# Patient Record
Sex: Female | Born: 1965
Health system: Southern US, Community
[De-identification: ages and names within clinical notes are randomized; demographics above are authoritative.]

## PROBLEM LIST (undated history)

## (undated) DIAGNOSIS — G43909 Migraine, unspecified, not intractable, without status migrainosus: Secondary | ICD-10-CM

## (undated) DIAGNOSIS — J343 Hypertrophy of nasal turbinates: Secondary | ICD-10-CM

## (undated) DIAGNOSIS — J342 Deviated nasal septum: Secondary | ICD-10-CM

## (undated) DIAGNOSIS — K900 Celiac disease: Secondary | ICD-10-CM

## (undated) DIAGNOSIS — J309 Allergic rhinitis, unspecified: Secondary | ICD-10-CM

## (undated) DIAGNOSIS — J45909 Unspecified asthma, uncomplicated: Secondary | ICD-10-CM

## (undated) HISTORY — PX: OTHER SURGICAL HISTORY: SHX169

## (undated) HISTORY — DX: Allergic rhinitis, unspecified: J30.9

## (undated) HISTORY — DX: Unspecified asthma, uncomplicated: J45.909

---

## 2007-01-13 ENCOUNTER — Encounter: Admission: RE | Admit: 2007-01-13 | Discharge: 2007-01-13 | Payer: Self-pay | Admitting: Family Medicine

## 2007-02-03 ENCOUNTER — Encounter: Admission: RE | Admit: 2007-02-03 | Discharge: 2007-02-03 | Payer: Self-pay | Admitting: Neurology

## 2011-04-29 ENCOUNTER — Ambulatory Visit: Payer: Self-pay | Admitting: *Deleted

## 2011-04-30 ENCOUNTER — Encounter: Payer: Self-pay | Admitting: *Deleted

## 2012-04-29 ENCOUNTER — Emergency Department (HOSPITAL_COMMUNITY): Payer: Managed Care, Other (non HMO)

## 2012-04-29 ENCOUNTER — Emergency Department (HOSPITAL_COMMUNITY)
Admission: EM | Admit: 2012-04-29 | Discharge: 2012-04-29 | Disposition: A | Discharge status: Home Health | Payer: Managed Care, Other (non HMO) | Attending: Emergency Medicine | Admitting: Emergency Medicine

## 2012-04-29 ENCOUNTER — Encounter (HOSPITAL_COMMUNITY): Payer: Self-pay | Admitting: *Deleted

## 2012-04-29 DIAGNOSIS — G43909 Migraine, unspecified, not intractable, without status migrainosus: Secondary | ICD-10-CM | POA: Insufficient documentation

## 2012-04-29 DIAGNOSIS — Z7982 Long term (current) use of aspirin: Secondary | ICD-10-CM | POA: Insufficient documentation

## 2012-04-29 DIAGNOSIS — Z87891 Personal history of nicotine dependence: Secondary | ICD-10-CM | POA: Insufficient documentation

## 2012-04-29 DIAGNOSIS — K900 Celiac disease: Secondary | ICD-10-CM | POA: Insufficient documentation

## 2012-04-29 HISTORY — DX: Migraine, unspecified, not intractable, without status migrainosus: G43.909

## 2012-04-29 HISTORY — DX: Celiac disease: K90.0

## 2012-04-29 MED ORDER — DIPHENHYDRAMINE HCL 50 MG/ML IJ SOLN
25.0000 mg | Freq: Once | INTRAMUSCULAR | Status: AC
  Administered 2012-04-29: 25 mg via INTRAVENOUS
  Filled 2012-04-29: qty 1

## 2012-04-29 MED ORDER — PROCHLORPERAZINE EDISYLATE 5 MG/ML IJ SOLN
10.0000 mg | Freq: Once | INTRAMUSCULAR | Status: AC
  Administered 2012-04-29: 10 mg via INTRAVENOUS
  Filled 2012-04-29: qty 2

## 2012-04-29 MED ORDER — DEXAMETHASONE SODIUM PHOSPHATE 10 MG/ML IJ SOLN
10.0000 mg | Freq: Once | INTRAMUSCULAR | Status: AC
  Administered 2012-04-29: 10 mg via INTRAVENOUS
  Filled 2012-04-29: qty 1

## 2012-04-29 MED ORDER — SODIUM CHLORIDE 0.9 % IV BOLUS (SEPSIS)
1000.0000 mL | Freq: Once | INTRAVENOUS | Status: AC
  Administered 2012-04-29: 1000 mL via INTRAVENOUS

## 2012-04-29 NOTE — ED Notes (Signed)
States she feels much better and is ready to go home and go to bed

## 2012-04-29 NOTE — ED Provider Notes (Signed)
History     CSN: [id number]  Arrival date & time 04/29/12  1605   First MD Initiated Contact with Patient 04/29/12 1654      Chief Complaint  Patient presents with  . Headache    (Consider location/radiation/quality/duration/timing/severity/associated sxs/prior treatment) Patient is a 46 y.o. female presenting with headaches. The history is provided by the patient and medical records.  Headache  This is a recurrent (patient with h/o migraines but today's much different) problem. The current episode started 6 to 12 hours ago. The problem occurs constantly. The problem has been gradually worsening. The headache is associated with an unknown factor. The pain is located in the occipital and frontal region. The quality of the pain is described as sharp. The pain is at a severity of 10/10. The pain is severe. The pain does not radiate. Associated symptoms include nausea and vomiting (x1 NBNB). Pertinent negatives include no fever. She has tried resting in a darkened room, triptan therapy and NSAIDs for the symptoms. The treatment provided mild relief.    Past Medical History  Diagnosis Date  . Migraine   . Celiac disease     History reviewed. No pertinent past surgical history.  No family history on file.  History  Substance Use Topics  . Smoking status: Former Games developer  . Smokeless tobacco: Not on file  . Alcohol Use: Yes     occ    OB History    Grav Para Term Preterm Abortions TAB SAB Ect Mult Living                  Review of Systems  Constitutional: Negative for fever.  Gastrointestinal: Positive for nausea and vomiting (x1 NBNB).  Neurological: Positive for headaches.  All other systems reviewed and are negative.    Allergies  Gluten meal  Home Medications   Current Outpatient Rx  Name Route Sig Dispense Refill  . ALBUTEROL SULFATE HFA 108 (90 BASE) MCG/ACT IN AERS Inhalation Inhale 2 puffs into the lungs every 6 (six) hours as needed. For shortness of breath     . ASPIRIN-ACETAMINOPHEN-CAFFEINE 250-250-65 MG PO TABS Oral Take 1 tablet by mouth every 6 (six) hours as needed. For migraine    . CETIRIZINE HCL 10 MG PO CHEW Oral Chew 10 mg by mouth at bedtime.    Marland Kitchen FEXOFENADINE HCL 180 MG PO TABS Oral Take 180 mg by mouth daily.    Marland Kitchen ZOLMITRIPTAN 5 MG PO TBDP Oral Take 5 mg by mouth daily as needed. For migraine      BP 145/87  Pulse 66  Temp 98.1 F (36.7 C) (Oral)  Resp 28  SpO2 99%  LMP 04/26/2012  Physical Exam  Nursing note and vitals reviewed. Constitutional: She is oriented to person, place, and time. She appears well-developed and well-nourished. She appears distressed (patient appears to be in pain).  HENT:  Head: Normocephalic and atraumatic.  Right Ear: External ear normal.  Left Ear: External ear normal.  Nose: Nose normal.  Mouth/Throat: Oropharynx is clear and moist.  Eyes: Conjunctivae normal and EOM are normal. Pupils are equal, round, and reactive to light.  Neck: Normal range of motion. Neck supple.  Cardiovascular: Normal rate, regular rhythm, normal heart sounds and intact distal pulses.  Exam reveals no gallop and no friction rub.   No murmur heard. Pulmonary/Chest: Effort normal and breath sounds normal.  Abdominal: Soft. Bowel sounds are normal. She exhibits no distension and no mass. There is no tenderness. There is no  rebound and no guarding.  Musculoskeletal: Normal range of motion. She exhibits no edema and no tenderness.  Neurological: She is alert and oriented to person, place, and time. She has normal reflexes. She displays normal reflexes. No cranial nerve deficit. She exhibits normal muscle tone. Coordination normal.  Skin: Skin is warm and dry.  Psychiatric: She has a normal mood and affect.    ED Course  Procedures (including critical care time)  Labs Reviewed - No data to display Ct Head Wo Contrast  04/29/2012  *RADIOLOGY REPORT*  Clinical Data:  Severe headache.  CT HEAD WITHOUT CONTRAST  Technique:  Contiguous axial images were obtained from the base of the skull through the vertex without contrast.  Comparison: The brain MR dated 07/25/2008 at [facility].  There is no report available for that examination.  Findings: Normal appearing cerebral hemispheres and posterior fossa structures.  Normal size and position of the ventricles.  No intracranial hemorrhage, mass lesion or evidence of acute infarction. Unremarkable bones.  Mild bilateral ethmoid and anterior sphenoid sinus mucosal thickening.  IMPRESSION: Mild chronic bilateral ethmoid and sphenoid sinusitis.  Otherwise, normal examination.   Original Report Authenticated By: Darrol Angel, M.D.      1. Migraine headache       MDM   The patient is a 39 with past medical history relevant for migraine headaches who presents with headache that is different in location and severity.  Afebrile and vital signs unremarkable. On exam patient with no focal neurological deficits and otherwise as above. Given headache was different from prior migraines and patient reported no prior imaging of the head it was felt that CT scan was warranted to rule out intercranial abnormality. Review of patient's imaging showed no evidence of acute process. Patient was given migraine cocktail for symptomatic relief, and after treatment she noted her pain had resolved.  She was felt to be stable for discharge with PCP follow up.           Johnney Ou, MD 04/30/12 [phone]

## 2012-04-29 NOTE — ED Notes (Signed)
Pt in CT.

## 2012-04-29 NOTE — ED Notes (Signed)
Pt is very anxious on arrival to room. Pt crying, states that she is scared that she has an anuyesm. Husband at bedside

## 2012-04-29 NOTE — ED Notes (Signed)
Pt has history of migraine, but states this is not a migraine and took medication.  Pt is stating that her head is about to explode.  PT has severe pain to base of neck and top of head.

## 2012-04-29 NOTE — ED Notes (Signed)
Pt remains in ct scan.

## 2012-04-30 NOTE — ED Provider Notes (Signed)
I saw and evaluated the patient, reviewed the resident's note and I agree with the findings and plan.  The patient presents here complaining of headache that started earlier today. She has a history of migraines but this seems different.  Her normal headache meds have not helped.  On exam, the patient is afebrile and the vitals are stable.  The heart and lung exams are unremarkable.  Neurologically, the exam is non-focal.  The pupils are equally reactive and eomi are intact.   There is no papilledema.  She had a ct which was unremarkable.  She was also given the migraine cocktail which gave her near complete relief.  She feels well enough to go home.      Geoffery Lyons, MD 04/30/12 2137

## 2016-07-10 DIAGNOSIS — N951 Menopausal and female climacteric states: Secondary | ICD-10-CM | POA: Diagnosis not present

## 2016-07-10 DIAGNOSIS — R635 Abnormal weight gain: Secondary | ICD-10-CM | POA: Diagnosis not present

## 2016-07-16 DIAGNOSIS — E538 Deficiency of other specified B group vitamins: Secondary | ICD-10-CM | POA: Diagnosis not present

## 2016-07-16 DIAGNOSIS — E663 Overweight: Secondary | ICD-10-CM | POA: Diagnosis not present

## 2016-07-16 DIAGNOSIS — E559 Vitamin D deficiency, unspecified: Secondary | ICD-10-CM | POA: Diagnosis not present

## 2016-07-16 DIAGNOSIS — N951 Menopausal and female climacteric states: Secondary | ICD-10-CM | POA: Diagnosis not present

## 2016-07-16 DIAGNOSIS — E782 Mixed hyperlipidemia: Secondary | ICD-10-CM | POA: Diagnosis not present

## 2016-07-18 DIAGNOSIS — E782 Mixed hyperlipidemia: Secondary | ICD-10-CM | POA: Diagnosis not present

## 2016-07-18 DIAGNOSIS — E559 Vitamin D deficiency, unspecified: Secondary | ICD-10-CM | POA: Diagnosis not present

## 2016-07-18 DIAGNOSIS — E663 Overweight: Secondary | ICD-10-CM | POA: Diagnosis not present

## 2016-07-25 DIAGNOSIS — E663 Overweight: Secondary | ICD-10-CM | POA: Diagnosis not present

## 2016-07-25 DIAGNOSIS — E559 Vitamin D deficiency, unspecified: Secondary | ICD-10-CM | POA: Diagnosis not present

## 2016-07-25 DIAGNOSIS — E782 Mixed hyperlipidemia: Secondary | ICD-10-CM | POA: Diagnosis not present

## 2016-08-01 DIAGNOSIS — E559 Vitamin D deficiency, unspecified: Secondary | ICD-10-CM | POA: Diagnosis not present

## 2016-08-01 DIAGNOSIS — E663 Overweight: Secondary | ICD-10-CM | POA: Diagnosis not present

## 2016-08-01 DIAGNOSIS — E782 Mixed hyperlipidemia: Secondary | ICD-10-CM | POA: Diagnosis not present

## 2016-08-22 DIAGNOSIS — E663 Overweight: Secondary | ICD-10-CM | POA: Diagnosis not present

## 2016-08-22 DIAGNOSIS — E782 Mixed hyperlipidemia: Secondary | ICD-10-CM | POA: Diagnosis not present

## 2016-08-22 DIAGNOSIS — E559 Vitamin D deficiency, unspecified: Secondary | ICD-10-CM | POA: Diagnosis not present

## 2016-08-26 DIAGNOSIS — Z803 Family history of malignant neoplasm of breast: Secondary | ICD-10-CM | POA: Diagnosis not present

## 2016-08-26 DIAGNOSIS — Z1231 Encounter for screening mammogram for malignant neoplasm of breast: Secondary | ICD-10-CM | POA: Diagnosis not present

## 2016-10-09 DIAGNOSIS — J45909 Unspecified asthma, uncomplicated: Secondary | ICD-10-CM | POA: Diagnosis not present

## 2016-10-09 DIAGNOSIS — G43909 Migraine, unspecified, not intractable, without status migrainosus: Secondary | ICD-10-CM | POA: Diagnosis not present

## 2016-11-29 DIAGNOSIS — J4520 Mild intermittent asthma, uncomplicated: Secondary | ICD-10-CM | POA: Diagnosis not present

## 2016-11-29 DIAGNOSIS — J3089 Other allergic rhinitis: Secondary | ICD-10-CM | POA: Diagnosis not present

## 2016-11-29 DIAGNOSIS — H1045 Other chronic allergic conjunctivitis: Secondary | ICD-10-CM | POA: Diagnosis not present

## 2016-11-29 DIAGNOSIS — L209 Atopic dermatitis, unspecified: Secondary | ICD-10-CM | POA: Diagnosis not present

## 2017-07-09 DIAGNOSIS — N898 Other specified noninflammatory disorders of vagina: Secondary | ICD-10-CM | POA: Diagnosis not present

## 2017-10-18 DIAGNOSIS — R030 Elevated blood-pressure reading, without diagnosis of hypertension: Secondary | ICD-10-CM | POA: Diagnosis not present

## 2017-10-19 DIAGNOSIS — Z91018 Allergy to other foods: Secondary | ICD-10-CM | POA: Diagnosis not present

## 2017-10-19 DIAGNOSIS — R079 Chest pain, unspecified: Secondary | ICD-10-CM | POA: Diagnosis not present

## 2017-10-19 DIAGNOSIS — R0602 Shortness of breath: Secondary | ICD-10-CM | POA: Diagnosis not present

## 2017-10-19 DIAGNOSIS — R072 Precordial pain: Secondary | ICD-10-CM | POA: Diagnosis not present

## 2017-10-21 DIAGNOSIS — R030 Elevated blood-pressure reading, without diagnosis of hypertension: Secondary | ICD-10-CM | POA: Diagnosis not present

## 2017-10-21 DIAGNOSIS — R079 Chest pain, unspecified: Secondary | ICD-10-CM | POA: Diagnosis not present

## 2017-10-21 DIAGNOSIS — K219 Gastro-esophageal reflux disease without esophagitis: Secondary | ICD-10-CM | POA: Diagnosis not present

## 2017-10-21 DIAGNOSIS — G43909 Migraine, unspecified, not intractable, without status migrainosus: Secondary | ICD-10-CM | POA: Diagnosis not present

## 2017-10-24 ENCOUNTER — Telehealth: Payer: Self-pay | Admitting: *Deleted

## 2017-10-24 NOTE — Telephone Encounter (Signed)
Referral sent to scheduling. 

## 2017-10-31 ENCOUNTER — Encounter: Payer: Self-pay | Admitting: Cardiology

## 2017-10-31 ENCOUNTER — Telehealth: Payer: Self-pay | Admitting: *Deleted

## 2017-10-31 NOTE — Telephone Encounter (Signed)
NOTES FAXED TO [facility] °

## 2017-11-08 NOTE — Progress Notes (Signed)
Cardiology Office Note   Date:  11/11/2017   ID:  Jericho Cieslik, DOB 1966/07/22, MRN [id number]  PCP:  Terri Piedra, PA-C  Cardiologist:   No primary care provider on file. Referring:  Terri Piedra, PA-C  Chief Complaint  Patient presents with  . Chest Pain      History of Present Illness: Newell Frater is a 52 y.o. female who is referred by Terri Piedra, PA-C for evaluation of chest pain.  She was in an ED ealier this month for this.  She said this happened 1 days.  It was 8 out of 10 mid chest intensity.  It was sharp.  There is a little pain in her right upper cheek.  She called EMS and her blood pressure was very high but she was very agitated at that point.  She did not go to the emergency room that evening.  However, it did go away after 4 hours but when she woke up in the morning there was some mild discomfort and so she went to the emergency room.  It was able to review some of these records.  The EKG was unremarkable and labs were unremarkable.  His discomfort resolved and she has not had it since then.  She is never had it before.  She was a little short of breath with this discomfort.  She did not have arm discomfort.  She had no nausea vomiting or diaphoresis.  She did not have palpitations.  She does not exercise routinely though she does occasionally.  She works at a preschool.  She does household chores and she does not bring any cardiovascular symptoms..      Past Medical History:  Diagnosis Date  . Allergic rhinitis   . Asthma   . Celiac disease   . Migraine     Past Surgical History:  Procedure Laterality Date  . NONE       Current Outpatient Medications  Medication Sig Dispense Refill  . albuterol (PROVENTIL HFA;VENTOLIN HFA) 108 (90 BASE) MCG/ACT inhaler Inhale 2 puffs into the lungs every 6 (six) hours as needed. For shortness of breath    . aspirin-acetaminophen-caffeine (EXCEDRIN MIGRAINE) 250-250-65 MG per tablet Take 1 tablet by  mouth every 6 (six) hours as needed. For migraine    . cetirizine (ZYRTEC) 10 MG chewable tablet Chew 10 mg by mouth at bedtime.    . fexofenadine (ALLEGRA) 180 MG tablet Take 180 mg by mouth daily.    . Fluticasone-Salmeterol (ADVAIR DISKUS) 250-50 MCG/DOSE AEPB Inhale 1 puff into the lungs 2 (two) times daily.    Marland Kitchen ibuprofen (ADVIL,MOTRIN) 800 MG tablet Take 800 mg by mouth 3 (three) times daily.    Marland Kitchen zolmitriptan (ZOMIG-ZMT) 5 MG disintegrating tablet Take 5 mg by mouth daily as needed. For migraine     No current facility-administered medications for this visit.     Allergies:   Gluten meal    Social History:  The patient  reports that she has quit smoking. Her smoking use included cigarettes. She has never used smokeless tobacco. She reports that she drinks alcohol. She reports that she does not use drugs.   Family History:  The patient's family history includes CAD (age of onset: 37) in her father; COPD in her father; Heart failure (age of onset: 23) in her mother; Pneumonia in her mother.    ROS:  Please see the history of present illness.   Otherwise, review of systems are positive for none.   All  other systems are reviewed and negative.    PHYSICAL EXAM: VS:  BP 134/87   Pulse 82   Ht 5\' 5"  (1.651 m)   Wt 180 lb (81.6 kg)   BMI 29.95 kg/m  , BMI Body mass index is 29.95 kg/m. GENERAL:  Well appearing HEENT:  Pupils equal round and reactive, fundi not visualized, oral mucosa unremarkable NECK:  No jugular venous distention, waveform within normal limits, carotid upstroke brisk and symmetric, no bruits, no thyromegaly LYMPHATICS:  No cervical, inguinal adenopathy LUNGS:  Clear to auscultation bilaterally BACK:  No CVA tenderness CHEST:  Unremarkable HEART:  PMI not displaced or sustained,S1 and S2 within normal limits, no S3, no S4, no clicks, no rubs, no murmurs ABD:  Flat, positive bowel sounds normal in frequency in pitch, no bruits, no rebound, no guarding, no midline  pulsatile mass, no hepatomegaly, no splenomegaly EXT:  2 plus pulses throughout, no edema, no cyanosis no clubbing SKIN:  No rashes no nodules NEURO:  Cranial nerves II through XII grossly intact, motor grossly intact throughout PSYCH:  Cognitively intact, oriented to person place and time    EKG:  EKG is not ordered today. The ekg ordered 3/3/ 19 demonstrates sinus rhythm, rate 70, axis within normal limits, intervals within normal limits, no acute ST-T wave changes.   Recent Labs: No results found for requested labs within last 8760 hours.    Lipid Panel No results found for: CHOL, TRIG, HDL, CHOLHDL, VLDL, LDLCALC, LDLDIRECT    Wt Readings from Last 3 Encounters:  11/11/17 180 lb (81.6 kg)      Other studies Reviewed: Additional studies/ records that were reviewed today include: ED records. Review of the above records demonstrates:  Please see elsewhere in the note.     ASSESSMENT AND PLAN:  CHEST PAIN: The pain is atypical.  However, she does have a family history of probable early coronary disease that was not entirely clear.  She has dyslipidemia.  Given this I would like to start with a coronary calcium score.  We talked at great length about risk reduction.  DYSLIPIDEMIA:  LDL in 2016 was 146.  I will check a lipid profile.  She has a gluten-free diet and we talked more about this.  We talked about exercise in particular.  Current medicines are reviewed at length with the patient today.  The patient does not have concerns regarding medicines.  The following changes have been made:  no change  Labs/ tests ordered today include:   Orders Placed This Encounter  Procedures  . CT CARDIAC SCORING  . Lipid panel     Disposition:   FU with as needed.      Signed, Rollene RotundaJames Ikeem Cleckler, MD  11/11/2017 3:19 PM    [facility]

## 2017-11-11 ENCOUNTER — Encounter: Payer: Self-pay | Admitting: Cardiology

## 2017-11-11 ENCOUNTER — Ambulatory Visit (INDEPENDENT_AMBULATORY_CARE_PROVIDER_SITE_OTHER): Payer: BLUE CROSS/BLUE SHIELD | Admitting: Cardiology

## 2017-11-11 VITALS — BP 134/87 | HR 82 | Ht 65.0 in | Wt 180.0 lb

## 2017-11-11 DIAGNOSIS — E785 Hyperlipidemia, unspecified: Secondary | ICD-10-CM | POA: Diagnosis not present

## 2017-11-11 DIAGNOSIS — R079 Chest pain, unspecified: Secondary | ICD-10-CM | POA: Diagnosis not present

## 2017-11-11 NOTE — Patient Instructions (Addendum)
Medication Instructions: Your physician recommends that you continue on your current medications as directed. Please refer to the Current Medication list given to you today.  If you need a refill on your cardiac medications before your next appointment, please call your pharmacy.   Labwork: Your physician recommends that you have a fasting Lipid done on the same day as the CT Calcium score test  Procedures/Testing: You provider has ordered a CT Cardiac score test. This will be done at the [address] office.  Follow-Up: Your physician wants you to follow-up as needed with Dr. Antoine PocheHochrein   Thank you for choosing [facility]!!

## 2017-11-18 ENCOUNTER — Ambulatory Visit (INDEPENDENT_AMBULATORY_CARE_PROVIDER_SITE_OTHER)
Admission: RE | Admit: 2017-11-18 | Discharge: 2017-11-18 | Disposition: A | Discharge status: Home / Self Care | Payer: Self-pay | Source: Ambulatory Visit | Attending: Cardiology | Admitting: Cardiology

## 2017-11-18 ENCOUNTER — Other Ambulatory Visit: Payer: BLUE CROSS/BLUE SHIELD

## 2017-11-18 DIAGNOSIS — R079 Chest pain, unspecified: Secondary | ICD-10-CM

## 2017-11-18 DIAGNOSIS — E785 Hyperlipidemia, unspecified: Secondary | ICD-10-CM | POA: Diagnosis not present

## 2017-11-18 LAB — LIPID PANEL
CHOLESTEROL TOTAL: 240 mg/dL — AB (ref 100–199)
Chol/HDL Ratio: 3.9 ratio (ref 0.0–4.4)
HDL: 61 mg/dL (ref >39)
LDL CALC: 147 mg/dL — AB (ref 0–99)
Triglycerides: 158 mg/dL — ABNORMAL HIGH (ref 0–149)
VLDL Cholesterol Cal: 32 mg/dL (ref 5–40)

## 2017-11-20 ENCOUNTER — Telehealth: Payer: Self-pay | Admitting: Cardiology

## 2017-11-20 NOTE — Telephone Encounter (Signed)
Spoke with patient who would like her coronary calcium score results and lipid panel. Patient is aware MD has to review the results but that her calcium score is 0.

## 2018-03-27 DIAGNOSIS — D125 Benign neoplasm of sigmoid colon: Secondary | ICD-10-CM | POA: Diagnosis not present

## 2018-03-27 DIAGNOSIS — K635 Polyp of colon: Secondary | ICD-10-CM | POA: Diagnosis not present

## 2018-03-27 DIAGNOSIS — Z1211 Encounter for screening for malignant neoplasm of colon: Secondary | ICD-10-CM | POA: Diagnosis not present

## 2018-03-27 DIAGNOSIS — K5730 Diverticulosis of large intestine without perforation or abscess without bleeding: Secondary | ICD-10-CM | POA: Diagnosis not present

## 2018-06-04 DIAGNOSIS — N951 Menopausal and female climacteric states: Secondary | ICD-10-CM | POA: Diagnosis not present

## 2018-06-05 DIAGNOSIS — Z1231 Encounter for screening mammogram for malignant neoplasm of breast: Secondary | ICD-10-CM | POA: Diagnosis not present

## 2018-06-15 DIAGNOSIS — N951 Menopausal and female climacteric states: Secondary | ICD-10-CM | POA: Diagnosis not present

## 2018-06-22 DIAGNOSIS — R6882 Decreased libido: Secondary | ICD-10-CM | POA: Diagnosis not present

## 2018-06-22 DIAGNOSIS — R232 Flushing: Secondary | ICD-10-CM | POA: Diagnosis not present

## 2018-06-22 DIAGNOSIS — Z72820 Sleep deprivation: Secondary | ICD-10-CM | POA: Diagnosis not present

## 2018-06-22 DIAGNOSIS — N951 Menopausal and female climacteric states: Secondary | ICD-10-CM | POA: Diagnosis not present

## 2018-06-24 DIAGNOSIS — Z131 Encounter for screening for diabetes mellitus: Secondary | ICD-10-CM | POA: Diagnosis not present

## 2018-06-24 DIAGNOSIS — Z0000 Encounter for general adult medical examination without abnormal findings: Secondary | ICD-10-CM | POA: Diagnosis not present

## 2018-06-24 DIAGNOSIS — Z13220 Encounter for screening for lipoid disorders: Secondary | ICD-10-CM | POA: Diagnosis not present

## 2018-06-24 DIAGNOSIS — Z23 Encounter for immunization: Secondary | ICD-10-CM | POA: Diagnosis not present

## 2018-06-24 DIAGNOSIS — Z124 Encounter for screening for malignant neoplasm of cervix: Secondary | ICD-10-CM | POA: Diagnosis not present

## 2018-06-30 DIAGNOSIS — J3089 Other allergic rhinitis: Secondary | ICD-10-CM | POA: Diagnosis not present

## 2018-06-30 DIAGNOSIS — J4520 Mild intermittent asthma, uncomplicated: Secondary | ICD-10-CM | POA: Diagnosis not present

## 2018-06-30 DIAGNOSIS — J301 Allergic rhinitis due to pollen: Secondary | ICD-10-CM | POA: Diagnosis not present

## 2018-06-30 DIAGNOSIS — J3081 Allergic rhinitis due to animal (cat) (dog) hair and dander: Secondary | ICD-10-CM | POA: Diagnosis not present

## 2018-07-10 DIAGNOSIS — J3081 Allergic rhinitis due to animal (cat) (dog) hair and dander: Secondary | ICD-10-CM | POA: Diagnosis not present

## 2018-07-10 DIAGNOSIS — J301 Allergic rhinitis due to pollen: Secondary | ICD-10-CM | POA: Diagnosis not present

## 2018-07-13 DIAGNOSIS — J3089 Other allergic rhinitis: Secondary | ICD-10-CM | POA: Diagnosis not present

## 2018-07-20 DIAGNOSIS — G479 Sleep disorder, unspecified: Secondary | ICD-10-CM | POA: Diagnosis not present

## 2018-07-20 DIAGNOSIS — Z72820 Sleep deprivation: Secondary | ICD-10-CM | POA: Diagnosis not present

## 2018-07-20 DIAGNOSIS — R232 Flushing: Secondary | ICD-10-CM | POA: Diagnosis not present

## 2018-07-20 DIAGNOSIS — R6882 Decreased libido: Secondary | ICD-10-CM | POA: Diagnosis not present

## 2018-07-20 DIAGNOSIS — N951 Menopausal and female climacteric states: Secondary | ICD-10-CM | POA: Diagnosis not present

## 2018-07-29 DIAGNOSIS — J310 Chronic rhinitis: Secondary | ICD-10-CM | POA: Diagnosis not present

## 2018-07-29 DIAGNOSIS — J338 Other polyp of sinus: Secondary | ICD-10-CM | POA: Diagnosis not present

## 2018-07-29 DIAGNOSIS — J343 Hypertrophy of nasal turbinates: Secondary | ICD-10-CM | POA: Diagnosis not present

## 2018-07-30 DIAGNOSIS — R6882 Decreased libido: Secondary | ICD-10-CM | POA: Diagnosis not present

## 2018-07-30 DIAGNOSIS — Z72820 Sleep deprivation: Secondary | ICD-10-CM | POA: Diagnosis not present

## 2018-07-30 DIAGNOSIS — N951 Menopausal and female climacteric states: Secondary | ICD-10-CM | POA: Diagnosis not present

## 2018-08-10 ENCOUNTER — Other Ambulatory Visit: Payer: Self-pay | Admitting: Otolaryngology

## 2018-08-10 DIAGNOSIS — J320 Chronic maxillary sinusitis: Secondary | ICD-10-CM

## 2018-08-18 ENCOUNTER — Other Ambulatory Visit: Payer: Self-pay | Admitting: Otolaryngology

## 2018-08-20 ENCOUNTER — Ambulatory Visit
Admission: RE | Admit: 2018-08-20 | Discharge: 2018-08-20 | Disposition: A | Discharge status: Home / Self Care | Payer: BLUE CROSS/BLUE SHIELD | Source: Ambulatory Visit | Attending: Otolaryngology | Admitting: Otolaryngology

## 2018-08-20 DIAGNOSIS — J329 Chronic sinusitis, unspecified: Secondary | ICD-10-CM | POA: Diagnosis not present

## 2018-08-20 DIAGNOSIS — J3089 Other allergic rhinitis: Secondary | ICD-10-CM | POA: Diagnosis not present

## 2018-08-20 DIAGNOSIS — J3081 Allergic rhinitis due to animal (cat) (dog) hair and dander: Secondary | ICD-10-CM | POA: Diagnosis not present

## 2018-08-20 DIAGNOSIS — J301 Allergic rhinitis due to pollen: Secondary | ICD-10-CM | POA: Diagnosis not present

## 2018-08-20 DIAGNOSIS — J320 Chronic maxillary sinusitis: Secondary | ICD-10-CM

## 2018-08-24 DIAGNOSIS — J3089 Other allergic rhinitis: Secondary | ICD-10-CM | POA: Diagnosis not present

## 2018-08-24 DIAGNOSIS — J3081 Allergic rhinitis due to animal (cat) (dog) hair and dander: Secondary | ICD-10-CM | POA: Diagnosis not present

## 2018-08-24 DIAGNOSIS — J301 Allergic rhinitis due to pollen: Secondary | ICD-10-CM | POA: Diagnosis not present

## 2018-08-26 DIAGNOSIS — J3081 Allergic rhinitis due to animal (cat) (dog) hair and dander: Secondary | ICD-10-CM | POA: Diagnosis not present

## 2018-08-26 DIAGNOSIS — J301 Allergic rhinitis due to pollen: Secondary | ICD-10-CM | POA: Diagnosis not present

## 2018-08-26 DIAGNOSIS — J3089 Other allergic rhinitis: Secondary | ICD-10-CM | POA: Diagnosis not present

## 2018-08-31 DIAGNOSIS — J3089 Other allergic rhinitis: Secondary | ICD-10-CM | POA: Diagnosis not present

## 2018-08-31 DIAGNOSIS — J3081 Allergic rhinitis due to animal (cat) (dog) hair and dander: Secondary | ICD-10-CM | POA: Diagnosis not present

## 2018-08-31 DIAGNOSIS — J301 Allergic rhinitis due to pollen: Secondary | ICD-10-CM | POA: Diagnosis not present

## 2018-09-01 DIAGNOSIS — J320 Chronic maxillary sinusitis: Secondary | ICD-10-CM | POA: Diagnosis not present

## 2018-09-01 DIAGNOSIS — J322 Chronic ethmoidal sinusitis: Secondary | ICD-10-CM | POA: Diagnosis not present

## 2018-09-01 DIAGNOSIS — J343 Hypertrophy of nasal turbinates: Secondary | ICD-10-CM | POA: Diagnosis not present

## 2018-09-01 DIAGNOSIS — J342 Deviated nasal septum: Secondary | ICD-10-CM | POA: Diagnosis not present

## 2018-09-04 DIAGNOSIS — J3081 Allergic rhinitis due to animal (cat) (dog) hair and dander: Secondary | ICD-10-CM | POA: Diagnosis not present

## 2018-09-04 DIAGNOSIS — J301 Allergic rhinitis due to pollen: Secondary | ICD-10-CM | POA: Diagnosis not present

## 2018-09-04 DIAGNOSIS — J3089 Other allergic rhinitis: Secondary | ICD-10-CM | POA: Diagnosis not present

## 2018-09-08 DIAGNOSIS — J301 Allergic rhinitis due to pollen: Secondary | ICD-10-CM | POA: Diagnosis not present

## 2018-09-08 DIAGNOSIS — J3089 Other allergic rhinitis: Secondary | ICD-10-CM | POA: Diagnosis not present

## 2018-09-08 DIAGNOSIS — J3081 Allergic rhinitis due to animal (cat) (dog) hair and dander: Secondary | ICD-10-CM | POA: Diagnosis not present

## 2018-09-10 DIAGNOSIS — J301 Allergic rhinitis due to pollen: Secondary | ICD-10-CM | POA: Diagnosis not present

## 2018-09-10 DIAGNOSIS — J3081 Allergic rhinitis due to animal (cat) (dog) hair and dander: Secondary | ICD-10-CM | POA: Diagnosis not present

## 2018-09-10 DIAGNOSIS — J3089 Other allergic rhinitis: Secondary | ICD-10-CM | POA: Diagnosis not present

## 2018-09-14 DIAGNOSIS — J3081 Allergic rhinitis due to animal (cat) (dog) hair and dander: Secondary | ICD-10-CM | POA: Diagnosis not present

## 2018-09-14 DIAGNOSIS — J3089 Other allergic rhinitis: Secondary | ICD-10-CM | POA: Diagnosis not present

## 2018-09-14 DIAGNOSIS — J301 Allergic rhinitis due to pollen: Secondary | ICD-10-CM | POA: Diagnosis not present

## 2018-09-16 DIAGNOSIS — J3081 Allergic rhinitis due to animal (cat) (dog) hair and dander: Secondary | ICD-10-CM | POA: Diagnosis not present

## 2018-09-16 DIAGNOSIS — J301 Allergic rhinitis due to pollen: Secondary | ICD-10-CM | POA: Diagnosis not present

## 2018-09-16 DIAGNOSIS — J3089 Other allergic rhinitis: Secondary | ICD-10-CM | POA: Diagnosis not present

## 2018-09-21 DIAGNOSIS — J3081 Allergic rhinitis due to animal (cat) (dog) hair and dander: Secondary | ICD-10-CM | POA: Diagnosis not present

## 2018-09-21 DIAGNOSIS — J3089 Other allergic rhinitis: Secondary | ICD-10-CM | POA: Diagnosis not present

## 2018-09-21 DIAGNOSIS — J301 Allergic rhinitis due to pollen: Secondary | ICD-10-CM | POA: Diagnosis not present

## 2018-09-23 DIAGNOSIS — J3081 Allergic rhinitis due to animal (cat) (dog) hair and dander: Secondary | ICD-10-CM | POA: Diagnosis not present

## 2018-09-23 DIAGNOSIS — J301 Allergic rhinitis due to pollen: Secondary | ICD-10-CM | POA: Diagnosis not present

## 2018-09-23 DIAGNOSIS — J3089 Other allergic rhinitis: Secondary | ICD-10-CM | POA: Diagnosis not present

## 2018-09-29 DIAGNOSIS — J3089 Other allergic rhinitis: Secondary | ICD-10-CM | POA: Diagnosis not present

## 2018-09-29 DIAGNOSIS — J301 Allergic rhinitis due to pollen: Secondary | ICD-10-CM | POA: Diagnosis not present

## 2018-09-29 DIAGNOSIS — J3081 Allergic rhinitis due to animal (cat) (dog) hair and dander: Secondary | ICD-10-CM | POA: Diagnosis not present

## 2018-10-02 DIAGNOSIS — J301 Allergic rhinitis due to pollen: Secondary | ICD-10-CM | POA: Diagnosis not present

## 2018-10-02 DIAGNOSIS — J3081 Allergic rhinitis due to animal (cat) (dog) hair and dander: Secondary | ICD-10-CM | POA: Diagnosis not present

## 2018-10-02 DIAGNOSIS — J3089 Other allergic rhinitis: Secondary | ICD-10-CM | POA: Diagnosis not present

## 2018-10-06 DIAGNOSIS — J3089 Other allergic rhinitis: Secondary | ICD-10-CM | POA: Diagnosis not present

## 2018-10-06 DIAGNOSIS — J301 Allergic rhinitis due to pollen: Secondary | ICD-10-CM | POA: Diagnosis not present

## 2018-10-06 DIAGNOSIS — J3081 Allergic rhinitis due to animal (cat) (dog) hair and dander: Secondary | ICD-10-CM | POA: Diagnosis not present

## 2018-10-12 DIAGNOSIS — J301 Allergic rhinitis due to pollen: Secondary | ICD-10-CM | POA: Diagnosis not present

## 2018-10-12 DIAGNOSIS — J3089 Other allergic rhinitis: Secondary | ICD-10-CM | POA: Diagnosis not present

## 2018-10-12 DIAGNOSIS — J3081 Allergic rhinitis due to animal (cat) (dog) hair and dander: Secondary | ICD-10-CM | POA: Diagnosis not present

## 2018-10-13 DIAGNOSIS — Z72820 Sleep deprivation: Secondary | ICD-10-CM | POA: Diagnosis not present

## 2018-10-13 DIAGNOSIS — N951 Menopausal and female climacteric states: Secondary | ICD-10-CM | POA: Diagnosis not present

## 2018-10-13 DIAGNOSIS — R6882 Decreased libido: Secondary | ICD-10-CM | POA: Diagnosis not present

## 2018-10-16 DIAGNOSIS — J3089 Other allergic rhinitis: Secondary | ICD-10-CM | POA: Diagnosis not present

## 2018-10-16 DIAGNOSIS — J3081 Allergic rhinitis due to animal (cat) (dog) hair and dander: Secondary | ICD-10-CM | POA: Diagnosis not present

## 2018-10-16 DIAGNOSIS — J301 Allergic rhinitis due to pollen: Secondary | ICD-10-CM | POA: Diagnosis not present

## 2018-10-20 DIAGNOSIS — J3081 Allergic rhinitis due to animal (cat) (dog) hair and dander: Secondary | ICD-10-CM | POA: Diagnosis not present

## 2018-10-20 DIAGNOSIS — J3089 Other allergic rhinitis: Secondary | ICD-10-CM | POA: Diagnosis not present

## 2018-10-20 DIAGNOSIS — J301 Allergic rhinitis due to pollen: Secondary | ICD-10-CM | POA: Diagnosis not present

## 2018-10-21 DIAGNOSIS — N951 Menopausal and female climacteric states: Secondary | ICD-10-CM | POA: Diagnosis not present

## 2018-10-21 DIAGNOSIS — R4586 Emotional lability: Secondary | ICD-10-CM | POA: Diagnosis not present

## 2018-10-21 DIAGNOSIS — R6882 Decreased libido: Secondary | ICD-10-CM | POA: Diagnosis not present

## 2018-10-29 DIAGNOSIS — J301 Allergic rhinitis due to pollen: Secondary | ICD-10-CM | POA: Diagnosis not present

## 2018-10-29 DIAGNOSIS — J3081 Allergic rhinitis due to animal (cat) (dog) hair and dander: Secondary | ICD-10-CM | POA: Diagnosis not present

## 2018-10-29 DIAGNOSIS — J3089 Other allergic rhinitis: Secondary | ICD-10-CM | POA: Diagnosis not present

## 2018-11-04 DIAGNOSIS — J3089 Other allergic rhinitis: Secondary | ICD-10-CM | POA: Diagnosis not present

## 2018-11-04 DIAGNOSIS — J301 Allergic rhinitis due to pollen: Secondary | ICD-10-CM | POA: Diagnosis not present

## 2018-11-04 DIAGNOSIS — J3081 Allergic rhinitis due to animal (cat) (dog) hair and dander: Secondary | ICD-10-CM | POA: Diagnosis not present

## 2018-11-10 DIAGNOSIS — J3089 Other allergic rhinitis: Secondary | ICD-10-CM | POA: Diagnosis not present

## 2018-11-10 DIAGNOSIS — J301 Allergic rhinitis due to pollen: Secondary | ICD-10-CM | POA: Diagnosis not present

## 2018-11-10 DIAGNOSIS — J3081 Allergic rhinitis due to animal (cat) (dog) hair and dander: Secondary | ICD-10-CM | POA: Diagnosis not present

## 2018-11-17 DIAGNOSIS — J301 Allergic rhinitis due to pollen: Secondary | ICD-10-CM | POA: Diagnosis not present

## 2018-11-17 DIAGNOSIS — J3089 Other allergic rhinitis: Secondary | ICD-10-CM | POA: Diagnosis not present

## 2018-11-17 DIAGNOSIS — J3081 Allergic rhinitis due to animal (cat) (dog) hair and dander: Secondary | ICD-10-CM | POA: Diagnosis not present

## 2018-11-23 DIAGNOSIS — J3089 Other allergic rhinitis: Secondary | ICD-10-CM | POA: Diagnosis not present

## 2018-11-23 DIAGNOSIS — J3081 Allergic rhinitis due to animal (cat) (dog) hair and dander: Secondary | ICD-10-CM | POA: Diagnosis not present

## 2018-11-23 DIAGNOSIS — J301 Allergic rhinitis due to pollen: Secondary | ICD-10-CM | POA: Diagnosis not present

## 2018-11-25 DIAGNOSIS — J301 Allergic rhinitis due to pollen: Secondary | ICD-10-CM | POA: Diagnosis not present

## 2018-11-25 DIAGNOSIS — J3081 Allergic rhinitis due to animal (cat) (dog) hair and dander: Secondary | ICD-10-CM | POA: Diagnosis not present

## 2018-11-25 DIAGNOSIS — J3089 Other allergic rhinitis: Secondary | ICD-10-CM | POA: Diagnosis not present

## 2018-12-01 DIAGNOSIS — J301 Allergic rhinitis due to pollen: Secondary | ICD-10-CM | POA: Diagnosis not present

## 2018-12-01 DIAGNOSIS — J3081 Allergic rhinitis due to animal (cat) (dog) hair and dander: Secondary | ICD-10-CM | POA: Diagnosis not present

## 2018-12-04 DIAGNOSIS — J3089 Other allergic rhinitis: Secondary | ICD-10-CM | POA: Diagnosis not present

## 2018-12-04 DIAGNOSIS — J3081 Allergic rhinitis due to animal (cat) (dog) hair and dander: Secondary | ICD-10-CM | POA: Diagnosis not present

## 2018-12-04 DIAGNOSIS — J301 Allergic rhinitis due to pollen: Secondary | ICD-10-CM | POA: Diagnosis not present

## 2018-12-08 DIAGNOSIS — J3081 Allergic rhinitis due to animal (cat) (dog) hair and dander: Secondary | ICD-10-CM | POA: Diagnosis not present

## 2018-12-08 DIAGNOSIS — J301 Allergic rhinitis due to pollen: Secondary | ICD-10-CM | POA: Diagnosis not present

## 2018-12-08 DIAGNOSIS — J3089 Other allergic rhinitis: Secondary | ICD-10-CM | POA: Diagnosis not present

## 2018-12-11 DIAGNOSIS — J3081 Allergic rhinitis due to animal (cat) (dog) hair and dander: Secondary | ICD-10-CM | POA: Diagnosis not present

## 2018-12-11 DIAGNOSIS — J301 Allergic rhinitis due to pollen: Secondary | ICD-10-CM | POA: Diagnosis not present

## 2018-12-11 DIAGNOSIS — J3089 Other allergic rhinitis: Secondary | ICD-10-CM | POA: Diagnosis not present

## 2018-12-23 DIAGNOSIS — J3089 Other allergic rhinitis: Secondary | ICD-10-CM | POA: Diagnosis not present

## 2018-12-23 DIAGNOSIS — J301 Allergic rhinitis due to pollen: Secondary | ICD-10-CM | POA: Diagnosis not present

## 2018-12-23 DIAGNOSIS — J3081 Allergic rhinitis due to animal (cat) (dog) hair and dander: Secondary | ICD-10-CM | POA: Diagnosis not present

## 2018-12-31 DIAGNOSIS — J301 Allergic rhinitis due to pollen: Secondary | ICD-10-CM | POA: Diagnosis not present

## 2018-12-31 DIAGNOSIS — J3081 Allergic rhinitis due to animal (cat) (dog) hair and dander: Secondary | ICD-10-CM | POA: Diagnosis not present

## 2018-12-31 DIAGNOSIS — J3089 Other allergic rhinitis: Secondary | ICD-10-CM | POA: Diagnosis not present

## 2019-01-08 DIAGNOSIS — J3081 Allergic rhinitis due to animal (cat) (dog) hair and dander: Secondary | ICD-10-CM | POA: Diagnosis not present

## 2019-01-08 DIAGNOSIS — J3089 Other allergic rhinitis: Secondary | ICD-10-CM | POA: Diagnosis not present

## 2019-01-08 DIAGNOSIS — J301 Allergic rhinitis due to pollen: Secondary | ICD-10-CM | POA: Diagnosis not present

## 2019-01-13 DIAGNOSIS — J3081 Allergic rhinitis due to animal (cat) (dog) hair and dander: Secondary | ICD-10-CM | POA: Diagnosis not present

## 2019-01-13 DIAGNOSIS — J301 Allergic rhinitis due to pollen: Secondary | ICD-10-CM | POA: Diagnosis not present

## 2019-01-14 DIAGNOSIS — J3089 Other allergic rhinitis: Secondary | ICD-10-CM | POA: Diagnosis not present

## 2019-01-15 DIAGNOSIS — J301 Allergic rhinitis due to pollen: Secondary | ICD-10-CM | POA: Diagnosis not present

## 2019-01-15 DIAGNOSIS — J3081 Allergic rhinitis due to animal (cat) (dog) hair and dander: Secondary | ICD-10-CM | POA: Diagnosis not present

## 2019-01-15 DIAGNOSIS — J3089 Other allergic rhinitis: Secondary | ICD-10-CM | POA: Diagnosis not present

## 2019-01-25 ENCOUNTER — Other Ambulatory Visit: Payer: Self-pay | Admitting: Otolaryngology

## 2019-01-27 DIAGNOSIS — J3081 Allergic rhinitis due to animal (cat) (dog) hair and dander: Secondary | ICD-10-CM | POA: Diagnosis not present

## 2019-01-27 DIAGNOSIS — J3089 Other allergic rhinitis: Secondary | ICD-10-CM | POA: Diagnosis not present

## 2019-01-27 DIAGNOSIS — J301 Allergic rhinitis due to pollen: Secondary | ICD-10-CM | POA: Diagnosis not present

## 2019-01-27 DIAGNOSIS — R4586 Emotional lability: Secondary | ICD-10-CM | POA: Diagnosis not present

## 2019-01-27 DIAGNOSIS — N951 Menopausal and female climacteric states: Secondary | ICD-10-CM | POA: Diagnosis not present

## 2019-01-27 DIAGNOSIS — R6882 Decreased libido: Secondary | ICD-10-CM | POA: Diagnosis not present

## 2019-01-29 DIAGNOSIS — R232 Flushing: Secondary | ICD-10-CM | POA: Diagnosis not present

## 2019-01-29 DIAGNOSIS — N951 Menopausal and female climacteric states: Secondary | ICD-10-CM | POA: Diagnosis not present

## 2019-01-29 DIAGNOSIS — R6882 Decreased libido: Secondary | ICD-10-CM | POA: Diagnosis not present

## 2019-02-01 ENCOUNTER — Other Ambulatory Visit: Payer: Self-pay

## 2019-02-01 ENCOUNTER — Encounter (HOSPITAL_BASED_OUTPATIENT_CLINIC_OR_DEPARTMENT_OTHER): Payer: Self-pay | Admitting: *Deleted

## 2019-02-04 ENCOUNTER — Other Ambulatory Visit (HOSPITAL_COMMUNITY)
Admission: RE | Admit: 2019-02-04 | Discharge: 2019-02-04 | Disposition: A | Discharge status: Home / Self Care | Payer: BC Managed Care – PPO | Source: Ambulatory Visit | Attending: Otolaryngology | Admitting: Otolaryngology

## 2019-02-04 DIAGNOSIS — J3081 Allergic rhinitis due to animal (cat) (dog) hair and dander: Secondary | ICD-10-CM | POA: Diagnosis not present

## 2019-02-04 DIAGNOSIS — J301 Allergic rhinitis due to pollen: Secondary | ICD-10-CM | POA: Diagnosis not present

## 2019-02-04 DIAGNOSIS — Z1159 Encounter for screening for other viral diseases: Secondary | ICD-10-CM | POA: Insufficient documentation

## 2019-02-04 DIAGNOSIS — J3089 Other allergic rhinitis: Secondary | ICD-10-CM | POA: Diagnosis not present

## 2019-02-05 LAB — SARS CORONAVIRUS 2 (TAT 6-24 HRS): SARS Coronavirus 2: NEGATIVE

## 2019-02-08 ENCOUNTER — Ambulatory Visit (HOSPITAL_BASED_OUTPATIENT_CLINIC_OR_DEPARTMENT_OTHER)
Admission: RE | Admit: 2019-02-08 | Discharge: 2019-02-08 | Disposition: A | Discharge status: Home / Self Care | Payer: BC Managed Care – PPO | Attending: Otolaryngology | Admitting: Otolaryngology

## 2019-02-08 ENCOUNTER — Other Ambulatory Visit: Payer: Self-pay

## 2019-02-08 ENCOUNTER — Encounter (HOSPITAL_BASED_OUTPATIENT_CLINIC_OR_DEPARTMENT_OTHER): Payer: Self-pay

## 2019-02-08 ENCOUNTER — Ambulatory Visit (HOSPITAL_BASED_OUTPATIENT_CLINIC_OR_DEPARTMENT_OTHER): Payer: BC Managed Care – PPO | Admitting: Anesthesiology

## 2019-02-08 ENCOUNTER — Encounter (HOSPITAL_BASED_OUTPATIENT_CLINIC_OR_DEPARTMENT_OTHER)
Admission: RE | Disposition: A | Discharge status: Home / Self Care | Payer: Self-pay | Source: Home / Self Care | Attending: Otolaryngology

## 2019-02-08 DIAGNOSIS — J343 Hypertrophy of nasal turbinates: Secondary | ICD-10-CM | POA: Diagnosis not present

## 2019-02-08 DIAGNOSIS — J322 Chronic ethmoidal sinusitis: Secondary | ICD-10-CM | POA: Diagnosis not present

## 2019-02-08 DIAGNOSIS — J320 Chronic maxillary sinusitis: Secondary | ICD-10-CM | POA: Diagnosis not present

## 2019-02-08 DIAGNOSIS — J329 Chronic sinusitis, unspecified: Secondary | ICD-10-CM | POA: Diagnosis not present

## 2019-02-08 DIAGNOSIS — J45909 Unspecified asthma, uncomplicated: Secondary | ICD-10-CM | POA: Insufficient documentation

## 2019-02-08 DIAGNOSIS — Z87891 Personal history of nicotine dependence: Secondary | ICD-10-CM | POA: Insufficient documentation

## 2019-02-08 DIAGNOSIS — J342 Deviated nasal septum: Secondary | ICD-10-CM | POA: Insufficient documentation

## 2019-02-08 DIAGNOSIS — J339 Nasal polyp, unspecified: Secondary | ICD-10-CM | POA: Insufficient documentation

## 2019-02-08 DIAGNOSIS — J321 Chronic frontal sinusitis: Secondary | ICD-10-CM | POA: Diagnosis not present

## 2019-02-08 DIAGNOSIS — J3489 Other specified disorders of nose and nasal sinuses: Secondary | ICD-10-CM | POA: Diagnosis not present

## 2019-02-08 DIAGNOSIS — J338 Other polyp of sinus: Secondary | ICD-10-CM | POA: Insufficient documentation

## 2019-02-08 HISTORY — PX: MAXILLARY ANTROSTOMY: SHX2003

## 2019-02-08 HISTORY — DX: Deviated nasal septum: J34.2

## 2019-02-08 HISTORY — PX: NASAL SEPTOPLASTY W/ TURBINOPLASTY: SHX2070

## 2019-02-08 HISTORY — DX: Hypertrophy of nasal turbinates: J34.3

## 2019-02-08 HISTORY — PX: SINUS ENDO WITH FUSION: SHX5329

## 2019-02-08 HISTORY — PX: ETHMOIDECTOMY: SHX5197

## 2019-02-08 HISTORY — PX: ENDOSCOPIC CONCHA BULLOSA RESECTION: SHX6395

## 2019-02-08 SURGERY — SEPTOPLASTY, NOSE, WITH NASAL TURBINATE REDUCTION
Anesthesia: General | Site: Nose

## 2019-02-08 MED ORDER — ONDANSETRON HCL 4 MG/2ML IJ SOLN
INTRAMUSCULAR | Status: DC | PRN
  Administered 2019-02-08: 4 mg via INTRAVENOUS

## 2019-02-08 MED ORDER — CIPROFLOXACIN-DEXAMETHASONE 0.3-0.1 % OT SUSP
OTIC | Status: AC
  Filled 2019-02-08: qty 7.5

## 2019-02-08 MED ORDER — PHENYLEPHRINE 40 MCG/ML (10ML) SYRINGE FOR IV PUSH (FOR BLOOD PRESSURE SUPPORT)
PREFILLED_SYRINGE | INTRAVENOUS | Status: DC | PRN
  Administered 2019-02-08: 80 ug via INTRAVENOUS

## 2019-02-08 MED ORDER — SCOPOLAMINE 1 MG/3DAYS TD PT72: 1.0000 | MEDICATED_PATCH | Freq: Once | TRANSDERMAL | Status: DC

## 2019-02-08 MED ORDER — FENTANYL CITRATE (PF) 100 MCG/2ML IJ SOLN
INTRAMUSCULAR | Status: AC
  Filled 2019-02-08: qty 2

## 2019-02-08 MED ORDER — DEXAMETHASONE SODIUM PHOSPHATE 4 MG/ML IJ SOLN
INTRAMUSCULAR | Status: DC | PRN
  Administered 2019-02-08: 10 mg via INTRAVENOUS

## 2019-02-08 MED ORDER — CEFAZOLIN SODIUM 1 G IJ SOLR
INTRAMUSCULAR | Status: AC
  Filled 2019-02-08: qty 20

## 2019-02-08 MED ORDER — LIDOCAINE-EPINEPHRINE 1 %-1:100000 IJ SOLN
INTRAMUSCULAR | Status: DC | PRN
  Administered 2019-02-08: 1.5 mL

## 2019-02-08 MED ORDER — OXYCODONE-ACETAMINOPHEN 5-325 MG PO TABS: 1.0000 | ORAL_TABLET | ORAL | 0 refills | Status: DC | PRN

## 2019-02-08 MED ORDER — SUCCINYLCHOLINE CHLORIDE 20 MG/ML IJ SOLN
INTRAMUSCULAR | Status: DC | PRN
  Administered 2019-02-08: 100 mg via INTRAVENOUS

## 2019-02-08 MED ORDER — AMOXICILLIN 875 MG PO TABS: 875.0000 mg | ORAL_TABLET | Freq: Two times a day (BID) | ORAL | 0 refills | Status: AC

## 2019-02-08 MED ORDER — OXYMETAZOLINE HCL 0.05 % NA SOLN
NASAL | Status: DC | PRN
  Administered 2019-02-08: 1 via TOPICAL

## 2019-02-08 MED ORDER — OXYMETAZOLINE HCL 0.05 % NA SOLN
NASAL | Status: AC
  Filled 2019-02-08: qty 30

## 2019-02-08 MED ORDER — CIPROFLOXACIN-FLUOCINOLONE PF 0.3-0.025 % OT SOLN
OTIC | Status: AC
  Filled 2019-02-08: qty 1

## 2019-02-08 MED ORDER — PROMETHAZINE HCL 25 MG/ML IJ SOLN
25.0000 mg | Freq: Four times a day (QID) | INTRAMUSCULAR | Status: DC | PRN
  Administered 2019-02-08: 13:00:00 6.25 mg via INTRAVENOUS

## 2019-02-08 MED ORDER — ONDANSETRON HCL 4 MG/2ML IJ SOLN
INTRAMUSCULAR | Status: AC
  Filled 2019-02-08: qty 2

## 2019-02-08 MED ORDER — DEXAMETHASONE SODIUM PHOSPHATE 10 MG/ML IJ SOLN
INTRAMUSCULAR | Status: AC
  Filled 2019-02-08: qty 1

## 2019-02-08 MED ORDER — PHENYLEPHRINE 40 MCG/ML (10ML) SYRINGE FOR IV PUSH (FOR BLOOD PRESSURE SUPPORT)
PREFILLED_SYRINGE | INTRAVENOUS | Status: AC
  Filled 2019-02-08: qty 10

## 2019-02-08 MED ORDER — MIDAZOLAM HCL 2 MG/2ML IJ SOLN
1.0000 mg | INTRAMUSCULAR | Status: DC | PRN
  Administered 2019-02-08: 2 mg via INTRAVENOUS

## 2019-02-08 MED ORDER — MUPIROCIN 2 % EX OINT
TOPICAL_OINTMENT | CUTANEOUS | Status: DC | PRN
  Administered 2019-02-08: 1 via TOPICAL

## 2019-02-08 MED ORDER — FENTANYL CITRATE (PF) 100 MCG/2ML IJ SOLN
50.0000 ug | INTRAMUSCULAR | Status: AC | PRN
  Administered 2019-02-08 (×4): 50 ug via INTRAVENOUS

## 2019-02-08 MED ORDER — LIDOCAINE 2% (20 MG/ML) 5 ML SYRINGE
INTRAMUSCULAR | Status: DC | PRN
  Administered 2019-02-08: 80 mg via INTRAVENOUS

## 2019-02-08 MED ORDER — CEFAZOLIN SODIUM-DEXTROSE 2-3 GM-%(50ML) IV SOLR
INTRAVENOUS | Status: DC | PRN
  Administered 2019-02-08: 2 g via INTRAVENOUS

## 2019-02-08 MED ORDER — SUCCINYLCHOLINE CHLORIDE 200 MG/10ML IV SOSY
PREFILLED_SYRINGE | INTRAVENOUS | Status: AC
  Filled 2019-02-08: qty 10

## 2019-02-08 MED ORDER — FENTANYL CITRATE (PF) 100 MCG/2ML IJ SOLN
25.0000 ug | INTRAMUSCULAR | Status: DC | PRN
  Administered 2019-02-08 (×2): 25 ug via INTRAVENOUS

## 2019-02-08 MED ORDER — LIDOCAINE 2% (20 MG/ML) 5 ML SYRINGE
INTRAMUSCULAR | Status: AC
  Filled 2019-02-08: qty 5

## 2019-02-08 MED ORDER — ROCURONIUM BROMIDE 10 MG/ML (PF) SYRINGE
PREFILLED_SYRINGE | INTRAVENOUS | Status: DC | PRN
  Administered 2019-02-08: 40 mg via INTRAVENOUS
  Administered 2019-02-08: 10 mg via INTRAVENOUS

## 2019-02-08 MED ORDER — MUPIROCIN 2 % EX OINT
TOPICAL_OINTMENT | CUTANEOUS | Status: AC
  Filled 2019-02-08: qty 22

## 2019-02-08 MED ORDER — OXYCODONE HCL 5 MG PO TABS: 5.0000 mg | ORAL_TABLET | Freq: Once | ORAL | Status: DC | PRN

## 2019-02-08 MED ORDER — OXYCODONE HCL 5 MG/5ML PO SOLN: 5.0000 mg | Freq: Once | ORAL | Status: DC | PRN

## 2019-02-08 MED ORDER — EPINEPHRINE PF 1 MG/ML IJ SOLN
INTRAMUSCULAR | Status: AC
  Filled 2019-02-08: qty 1

## 2019-02-08 MED ORDER — SODIUM CHLORIDE (PF) 0.9 % IJ SOLN
INTRAMUSCULAR | Status: AC
  Filled 2019-02-08: qty 10

## 2019-02-08 MED ORDER — ONDANSETRON HCL 4 MG/2ML IJ SOLN
4.0000 mg | Freq: Once | INTRAMUSCULAR | Status: AC | PRN
  Administered 2019-02-08: 12:00:00 4 mg via INTRAVENOUS

## 2019-02-08 MED ORDER — LIDOCAINE-EPINEPHRINE 1 %-1:100000 IJ SOLN
INTRAMUSCULAR | Status: AC
  Filled 2019-02-08: qty 1

## 2019-02-08 MED ORDER — ROCURONIUM BROMIDE 10 MG/ML (PF) SYRINGE
PREFILLED_SYRINGE | INTRAVENOUS | Status: AC
  Filled 2019-02-08: qty 10

## 2019-02-08 MED ORDER — MIDAZOLAM HCL 2 MG/2ML IJ SOLN
INTRAMUSCULAR | Status: AC
  Filled 2019-02-08: qty 2

## 2019-02-08 MED ORDER — ESMOLOL HCL 100 MG/10ML IV SOLN
INTRAVENOUS | Status: DC | PRN
  Administered 2019-02-08 (×2): 10 mg via INTRAVENOUS
  Administered 2019-02-08: 30 mg via INTRAVENOUS
  Administered 2019-02-08: 10 mg via INTRAVENOUS

## 2019-02-08 MED ORDER — LACTATED RINGERS IV SOLN
INTRAVENOUS | Status: DC
  Administered 2019-02-08 (×2): via INTRAVENOUS

## 2019-02-08 MED ORDER — PROPOFOL 10 MG/ML IV BOLUS
INTRAVENOUS | Status: DC | PRN
  Administered 2019-02-08: 200 mg via INTRAVENOUS

## 2019-02-08 MED ORDER — ESMOLOL HCL 100 MG/10ML IV SOLN
INTRAVENOUS | Status: AC
  Filled 2019-02-08: qty 20

## 2019-02-08 MED ORDER — PROMETHAZINE HCL 25 MG/ML IJ SOLN
INTRAMUSCULAR | Status: AC
  Filled 2019-02-08: qty 1

## 2019-02-08 SURGICAL SUPPLY — 75 items
ATTRACTOMAT 16X20 MAGNETIC DRP (DRAPES) IMPLANT
BLADE RAD40 ROTATE 4M 4 5PK (BLADE) IMPLANT
BLADE RAD40 ROTATE 4M 4MM 5PK (BLADE)
BLADE RAD60 ROTATE M4 4 5PK (BLADE) IMPLANT
BLADE RAD60 ROTATE M4 4MM 5PK (BLADE)
BLADE ROTATE RAD 12 4 M4 (BLADE) IMPLANT
BLADE ROTATE RAD 12 4MM M4 (BLADE)
BLADE ROTATE RAD 40 4 M4 (BLADE) IMPLANT
BLADE ROTATE RAD 40 4MM M4 (BLADE)
BLADE ROTATE TRICUT 4MX13CM M4 (BLADE) ×1
BLADE ROTATE TRICUT 4X13 M4 (BLADE) ×5 IMPLANT
BLADE TRICUT ROTATE M4 4 5PK (BLADE) IMPLANT
BLADE TRICUT ROTATE M4 4MM 5PK (BLADE)
BUR HS RAD FRONTAL 3 (BURR) IMPLANT
BUR HS RAD FRONTAL 3MM (BURR)
CANISTER SUC SOCK COL 7IN (MISCELLANEOUS) ×12 IMPLANT
CANISTER SUCT 1200ML W/VALVE (MISCELLANEOUS) ×6 IMPLANT
COAGULATOR SUCT 6 FR SWTCH (ELECTROSURGICAL)
COAGULATOR SUCT 8FR VV (MISCELLANEOUS) ×6 IMPLANT
COAGULATOR SUCT SWTCH 10FR 6 (ELECTROSURGICAL) IMPLANT
COVER WAND RF STERILE (DRAPES) IMPLANT
DECANTER SPIKE VIAL GLASS SM (MISCELLANEOUS) IMPLANT
DRSG NASAL KENNEDY LMNT 8CM (GAUZE/BANDAGES/DRESSINGS) IMPLANT
DRSG NASOPORE 8CM (GAUZE/BANDAGES/DRESSINGS) IMPLANT
DRSG TELFA 3X8 NADH (GAUZE/BANDAGES/DRESSINGS) IMPLANT
ELECT REM PT RETURN 9FT ADLT (ELECTROSURGICAL) ×6
ELECTRODE REM PT RTRN 9FT ADLT (ELECTROSURGICAL) ×4 IMPLANT
GLOVE BIO SURGEON STRL SZ 6.5 (GLOVE) ×1 IMPLANT
GLOVE BIO SURGEON STRL SZ7 (GLOVE) ×2 IMPLANT
GLOVE BIO SURGEON STRL SZ7.5 (GLOVE) ×6 IMPLANT
GLOVE BIO SURGEONS STRL SZ 6.5 (GLOVE) ×1
GLOVE BIOGEL PI IND STRL 7.0 (GLOVE) IMPLANT
GLOVE BIOGEL PI IND STRL 7.5 (GLOVE) IMPLANT
GLOVE BIOGEL PI INDICATOR 7.0 (GLOVE) ×2
GLOVE BIOGEL PI INDICATOR 7.5 (GLOVE) ×2
GOWN STRL REUS W/ TWL LRG LVL3 (GOWN DISPOSABLE) ×8 IMPLANT
GOWN STRL REUS W/TWL LRG LVL3 (GOWN DISPOSABLE) ×4
HEMOSTAT SURGICEL 2X14 (HEMOSTASIS) IMPLANT
IV NS 1000ML (IV SOLUTION)
IV NS 1000ML BAXH (IV SOLUTION) IMPLANT
IV NS 500ML (IV SOLUTION) ×2
IV NS 500ML BAXH (IV SOLUTION) ×8 IMPLANT
NDL HYPO 25X1 1.5 SAFETY (NEEDLE) ×4 IMPLANT
NDL PRECISIONGLIDE 27X1.5 (NEEDLE) ×4 IMPLANT
NDL SPNL 25GX3.5 QUINCKE BL (NEEDLE) IMPLANT
NEEDLE HYPO 25X1 1.5 SAFETY (NEEDLE) ×6 IMPLANT
NEEDLE PRECISIONGLIDE 27X1.5 (NEEDLE) ×6 IMPLANT
NEEDLE SPNL 25GX3.5 QUINCKE BL (NEEDLE) ×6 IMPLANT
NS IRRIG 1000ML POUR BTL (IV SOLUTION) ×6 IMPLANT
PACK BASIN DAY SURGERY FS (CUSTOM PROCEDURE TRAY) ×6 IMPLANT
PACK ENT DAY SURGERY (CUSTOM PROCEDURE TRAY) ×6 IMPLANT
PAD DRESSING TELFA 3X8 NADH (GAUZE/BANDAGES/DRESSINGS) IMPLANT
SLEEVE SCD COMPRESS KNEE MED (MISCELLANEOUS) ×2 IMPLANT
SOLUTION BUTLER CLEAR DIP (MISCELLANEOUS) ×12 IMPLANT
SPLINT NASAL AIRWAY SILICONE (MISCELLANEOUS) IMPLANT
SPONGE GAUZE 2X2 8PLY STER LF (GAUZE/BANDAGES/DRESSINGS) ×1
SPONGE GAUZE 2X2 8PLY STRL LF (GAUZE/BANDAGES/DRESSINGS) ×5 IMPLANT
SPONGE NEURO XRAY DETECT 1X3 (DISPOSABLE) ×6 IMPLANT
SUCTION FRAZIER HANDLE 10FR (MISCELLANEOUS)
SUCTION TUBE FRAZIER 10FR DISP (MISCELLANEOUS) IMPLANT
SUT CHROMIC 4 0 P 3 18 (SUTURE) ×6 IMPLANT
SUT PLAIN 4 0 ~~LOC~~ 1 (SUTURE) ×6 IMPLANT
SUT PROLENE 3 0 PS 2 (SUTURE) ×2 IMPLANT
SUT VIC AB 4-0 P-3 18XBRD (SUTURE) IMPLANT
SUT VIC AB 4-0 P3 18 (SUTURE)
SYR 50ML LL SCALE MARK (SYRINGE) ×6 IMPLANT
TOWEL GREEN STERILE FF (TOWEL DISPOSABLE) ×6 IMPLANT
TRACKER ENT INSTRUMENT (MISCELLANEOUS) ×6 IMPLANT
TRACKER ENT PATIENT (MISCELLANEOUS) ×6 IMPLANT
TUBE CONNECTING 20'X1/4 (TUBING) ×1
TUBE CONNECTING 20X1/4 (TUBING) ×5 IMPLANT
TUBE SALEM SUMP 12R W/ARV (TUBING) IMPLANT
TUBE SALEM SUMP 16 FR W/ARV (TUBING) ×6 IMPLANT
TUBING STRAIGHTSHOT EPS 5PK (TUBING) ×6 IMPLANT
YANKAUER SUCT BULB TIP NO VENT (SUCTIONS) ×6 IMPLANT

## 2019-02-08 NOTE — Op Note (Signed)
DATE OF PROCEDURE: 02/08/2019  OPERATIVE REPORT   SURGEON: Newman PiesSu Erendida Wrenn, MD   PREOPERATIVE DIAGNOSES:  1. Severe nasal septal deviation.  2. Bilateral inferior turbinate hypertrophy.  3. Chronic nasal obstruction. 4. Bilateral chronic rhinosinusitis and polyposis. 5. Left concha bullosa.  POSTOPERATIVE DIAGNOSES:  1. Severe nasal septal deviation.  2. Bilateral inferior turbinate hypertrophy.  3. Chronic nasal obstruction. 4. Bilateral chronic rhinosinusitis and polyposis. 5. Left concha bullosa.  PROCEDURE PERFORMED:  1.  Septoplasty.  2.  Left endoscopic frontal sinusotomy and total ethmoidectomy 3.  Right endoscopic total ethmoidectomy. 4.  Bilateral endoscopic maxillary antrostomy with polyp removal. 5.  Bilateral partial inferior turbinate resection. 6.  Left endoscopic concha bullosa resection. 7.  FUSION stereotactic image guidance.  ANESTHESIA: General endotracheal tube anesthesia.   COMPLICATIONS: None.   ESTIMATED BLOOD LOSS: 250 mL.   INDICATION FOR PROCEDURE: Alicia Martin is a 53 y.o. female with a history of chronic rhinosinusitis and polyposis, with frequent nasal obstruction. The patient was treated with multiple courses of antibiotics, immunotherapy, antihistamine, decongestant, steroid nasal spray, and systemic steroids. However, the patient continued to be symptomatic. On examination, the patient was noted to have bilateral severe inferior turbinate hypertrophy and significant nasal septal deviation, causing significant nasal obstruction.  Polypoid tissue was noted to obstruct both nasal cavities.  On her CT scan, partial opacification was noted in bilateral maxillary and ethmoid sinuses, as well as the left frontal recess.  The patient also has a left concha bullosa.  Based on the above findings, the decision was made for the patient to undergo the above-stated procedures. The risks, benefits, alternatives, and details of the procedures were discussed with the  patient. Questions were invited and answered. Informed consent was obtained.   DESCRIPTION OF PROCEDURE: The patient was taken to the operating room and placed supine on the operating table. General endotracheal tube anesthesia was administered by the anesthesiologist. The patient was positioned, and prepped and draped in the standard fashion for nasal surgery. Pledgets soaked with Afrin were placed in both nasal cavities for decongestion. The pledgets were subsequently removed. The FUSION stereotactic image guidance marker was placed. The image guidance system was functional throughout the case.  Examination of the nasal cavity revealed a severe nasal septal deviationd. 1% lidocaine with 1:100,000 epinephrine was injected onto the nasal septum bilaterally. A hemitransfixion incision was made on the left side. The mucosal flap was carefully elevated on the left side. A cartilaginous incision was made 1 cm superior to the caudal margin of the nasal septum. Mucosal flap was also elevated on the right side in the similar fashion. It should be noted that due to the severe septal deviation, the deviated portion of the cartilaginous and bony septum had to be removed in piecemeal fashion. Once the deviated portions were removed, a straight midline septum was achieved. The septum was then quilted with 4-0 plain gut sutures. The hemitransfixion incision was closed with interrupted 4-0 chromic sutures.   The inferior one half of both hypertrophied inferior turbinate was crossclamped with a Kelly clamp. The inferior one half of each inferior turbinate was then resected with a pair of cross cutting scissors. Hemostasis was achieved with a suction cautery device.   Using a 0 endoscope, the left nasal cavity was examined. A large concha bullosa was noted. Using Tru-Cut forceps, the inferior one third and medial one half of the concha bullosa was resected. Polypoid tissue was noted within the middle meatus. The polypoid  tissue was removed using a  combination of microdebrider and Blakesley forceps. The uncinate process was resected with a freer elevator. The maxillary antrum was entered and enlarged using a combination of backbiter and microdebrider. Polypoid tissue was removed from the left maxillary sinus.  Attention was then focused on the ethmoid sinuses. The bony partitions of the anterior and posterior ethmoid cavities were taken down. Polypoid tissue was noted and removed. Attention was then focused on the frontal sinus. The frontal recess was identified and enlarged by removing the surrounding bony partitions. Polypoid tissue was removed from the frontal recess. All sinuses were copiously irrigated with saline solution.  The same procedures were repeated on the right side.  Except no concha bullosa was noted on the right side, and the right frontal sinus was not entered.  More polyps were noted on the right side. All polyps were removed. Doyle splints were applied to the nasal septum.  The care of the patient was turned over to the anesthesiologist. The patient was awakened from anesthesia without difficulty. The patient was extubated and transferred to the recovery room in good condition.   OPERATIVE FINDINGS: Severe nasal septal deviation and bilateral inferior turbinate hypertrophy.  Bilateral chronic rhinosinusitis, with polyposis noted within both maxillary and ethmoid sinuses and the left frontal recess.  SPECIMEN: Bilateral sinus contents.  FOLLOWUP CARE: The patient be discharged home once she is awake and alert. The patient will be placed on Percocet 1 tablets p.o. q.4 hours p.r.n. pain, and amoxicillin 875 mg p.o. b.i.d. for 5 days. The patient will follow up in my office in approximately 1 week for splint removal.   Kwali Wrinkle Raynelle Bring, MD

## 2019-02-08 NOTE — Anesthesia Procedure Notes (Signed)
Procedure Name: Intubation Date/Time: 02/08/2019 10:21 AM Performed by: Lieutenant Diego, CRNA Pre-anesthesia Checklist: Patient identified, Emergency Drugs available, Suction available and Patient being monitored Patient Re-evaluated:Patient Re-evaluated prior to induction Oxygen Delivery Method: Circle system utilized Preoxygenation: Pre-oxygenation with 100% oxygen Induction Type: IV induction Ventilation: Mask ventilation without difficulty Laryngoscope Size: Miller and 2 Grade View: Grade I Tube type: Oral Tube size: 7.0 mm Number of attempts: 1 Airway Equipment and Method: Stylet and Oral airway Placement Confirmation: ETT inserted through vocal cords under direct vision,  positive ETCO2 and breath sounds checked- equal and bilateral Secured at: 22 cm Tube secured with: Tape Dental Injury: Teeth and Oropharynx as per pre-operative assessment

## 2019-02-08 NOTE — H&P (Signed)
Cc: Chronic rhinosinusitis, polyposis  HPI: The patient is a 53 year old female who returns today for her follow-up evaluation. The patient was last seen 1 month ago.  At that time, she was noted to have a large polyp in the left nasal cavity.  Smaller polypoid tissue was noted within the right nasal cavity.  The patient also has a history of severe environmental allergies.  She has noted frequent nasal obstruction, rhinorrhea, facial pain and pressure, and decreased sense of smell.  The patient was treated with Zyrtec, Singulair, Flonase nasal spray, Allegra, and immunotherapy.  She was also treated with systemic steroids.  She continued to be symptomatic.  On her CT scan, she was noted to have mucosal disease of the left frontal recess, bilateral maxillary sinuses, and bilateral ethmoid sinuses.  In addition, she also has a large leftward septal spur and bilateral inferior turbinate hypertrophy.  The patient returns today reporting no improvement in her symptoms. She is interested in more definitive treatment of her condition.  No other ENT, GI, or respiratory issue noted since the last visit.   Exam: General: Communicates without difficulty, well nourished, no acute distress. Head: Normocephalic, no evidence injury, no tenderness, facial buttresses intact without stepoff. Face/sinus: No tenderness to palpation and percussion. Facial movement is normal and symmetric. Eyes: PERRL, EOMI. No scleral icterus, conjunctivae clear. Neuro: CN II exam reveals vision grossly intact.  No nystagmus at any point of gaze. Ears: Auricles well formed without lesions.  Ear canals are intact without mass or lesion.  No erythema or edema is appreciated.  The TMs are intact without fluid. Nose: External evaluation reveals normal support and skin without lesions.  Dorsum is intact.  Anterior rhinoscopy reveals congested mucosa over anterior aspect of inferior turbinates and septum. Oral:  Oral cavity and oropharynx are intact,  symmetric, without erythema or edema.  Mucosa is moist without lesions. Neck: Full range of motion without pain.  There is no significant lymphadenopathy.  No masses palpable.  Thyroid bed within normal limits to palpation.  Parotid glands and submandibular glands equal bilaterally without mass.  Trachea is midline. Neuro:  CN 2-12 grossly intact. Gait normal.   Assessment 1.  Bilateral chronic rhinosinusitis and sinonasal polyps.  Her sinus disease involves left frontal recess, bilateral maxillary sinuses, and bilateral ethmoid air cells.  She also has significant nasal septal deviation and bilateral inferior turbinate hypertrophy.  2.  The patient has not responded to medical treatment so far.   Plan  1.  The physical exam findings and the CT images are extensively reviewed with the patient and her husband.   2.  In light of her persistent symptoms, she may benefit from surgical intervention with septoplasty, turbinate reduction and bilateral endoscopic sinus surgery that involves her left frontal recess and bilateral maxillary and ethmoid cavities. The risks, benefits, alternatives and details of the procedures are extensively discussed.   3.  The patient would like to proceed with the procedures.

## 2019-02-08 NOTE — Anesthesia Postprocedure Evaluation (Signed)
Anesthesia Post Note  Patient: Alicia Martin  Procedure(s) Performed: NASAL SEPTOPLASTY WITH TURBINATE REDUCTION (N/A Nose) SINUS ENDO WITH FUSION (N/A Nose) TOTAL ENDOSCOPIC  ETHMOIDECTOMY AND LEFT  FRONTAL RECESS (Left Nose) MAXILLARY ANTROSTOMY WITH TISSUE REMOVAL (Bilateral Nose) ENDOSCOPIC CONCHA BULLOSA RESECTION (Nose)     Patient location during evaluation: PACU Anesthesia Type: General Level of consciousness: sedated and patient cooperative Pain management: pain level controlled Vital Signs Assessment: post-procedure vital signs reviewed and stable Respiratory status: spontaneous breathing Cardiovascular status: stable Anesthetic complications: yes Anesthetic complication details: PONV   Last Vitals:  Vitals:   02/08/19 1330 02/08/19 1345  BP: (!) 161/92 (!) 163/96  Pulse: 75 88  Resp: 13 15  Temp:    SpO2: 95% 99%    Last Pain:  Vitals:   02/08/19 1330  TempSrc:   PainSc: [place]

## 2019-02-08 NOTE — Transfer of Care (Signed)
Immediate Anesthesia Transfer of Care Note  Patient: Alicia Martin  Procedure(s) Performed: NASAL SEPTOPLASTY WITH TURBINATE REDUCTION (N/A Nose) SINUS ENDO WITH FUSION (N/A Nose) TOTAL ENDOSCOPIC  ETHMOIDECTOMY AND LEFT  FRONTAL RECESS (Left Nose) MAXILLARY ANTROSTOMY WITH TISSUE REMOVAL (Bilateral Nose) ENDOSCOPIC CONCHA BULLOSA RESECTION (Nose)  Patient Location: PACU  Anesthesia Type:General  Level of Consciousness: awake  Airway & Oxygen Therapy: Patient Spontanous Breathing and Patient connected to face mask oxygen  Post-op Assessment: Report given to RN and Post -op Vital signs reviewed and stable  Post vital signs: Reviewed and stable  Last Vitals:  Vitals Value Taken Time  BP 135/78 02/08/19 1208  Temp    Pulse 100 02/08/19 1211  Resp 15 02/08/19 1211  SpO2 100 % 02/08/19 1211  Vitals shown include unvalidated device data.  Last Pain:  Vitals:   02/08/19 0846  TempSrc: Oral  PainSc: 0-No pain         Complications: No apparent anesthesia complications

## 2019-02-08 NOTE — Anesthesia Preprocedure Evaluation (Signed)
Anesthesia Evaluation  Patient identified by MRN, date of birth, ID band Patient awake    Reviewed: Allergy & Precautions, NPO status , Patient's Chart, lab work & pertinent test results  History of Anesthesia Complications Negative for: history of anesthetic complications  Airway Mallampati: III  TM Distance: >3 FB Neck ROM: Full    Dental  (+) Teeth Intact   Pulmonary asthma , former smoker,    Pulmonary exam normal        Cardiovascular negative cardio ROS Normal cardiovascular exam     Neuro/Psych  Headaches, negative psych ROS   GI/Hepatic Neg liver ROS, celiac   Endo/Other  negative endocrine ROS  Renal/GU negative Renal ROS  negative genitourinary   Musculoskeletal negative musculoskeletal ROS (+)   Abdominal   Peds  Hematology negative hematology ROS (+)   Anesthesia Other Findings   Reproductive/Obstetrics                             Anesthesia Physical Anesthesia Plan  ASA: II  Anesthesia Plan: General   Post-op Pain Management:    Induction: Intravenous  PONV Risk Score and Plan: 3 and Ondansetron, Dexamethasone, Midazolam and Treatment may vary due to age or medical condition  Airway Management Planned: Oral ETT  Additional Equipment: None  Intra-op Plan:   Post-operative Plan: Extubation in OR  Informed Consent: I have reviewed the patients History and Physical, chart, labs and discussed the procedure including the risks, benefits and alternatives for the proposed anesthesia with the patient or authorized representative who has indicated his/her understanding and acceptance.     Dental advisory given  Plan Discussed with:   Anesthesia Plan Comments:         Anesthesia Quick Evaluation

## 2019-02-08 NOTE — Discharge Instructions (Addendum)

## 2019-02-09 ENCOUNTER — Encounter (HOSPITAL_BASED_OUTPATIENT_CLINIC_OR_DEPARTMENT_OTHER): Payer: Self-pay | Admitting: Otolaryngology

## 2019-02-12 DIAGNOSIS — J322 Chronic ethmoidal sinusitis: Secondary | ICD-10-CM | POA: Diagnosis not present

## 2019-02-12 DIAGNOSIS — J338 Other polyp of sinus: Secondary | ICD-10-CM | POA: Diagnosis not present

## 2019-02-12 DIAGNOSIS — J321 Chronic frontal sinusitis: Secondary | ICD-10-CM | POA: Diagnosis not present

## 2019-02-12 DIAGNOSIS — J320 Chronic maxillary sinusitis: Secondary | ICD-10-CM | POA: Diagnosis not present

## 2019-02-15 DIAGNOSIS — G43109 Migraine with aura, not intractable, without status migrainosus: Secondary | ICD-10-CM | POA: Diagnosis not present

## 2019-02-18 DIAGNOSIS — J3081 Allergic rhinitis due to animal (cat) (dog) hair and dander: Secondary | ICD-10-CM | POA: Diagnosis not present

## 2019-02-18 DIAGNOSIS — J3089 Other allergic rhinitis: Secondary | ICD-10-CM | POA: Diagnosis not present

## 2019-02-18 DIAGNOSIS — J301 Allergic rhinitis due to pollen: Secondary | ICD-10-CM | POA: Diagnosis not present

## 2019-02-22 DIAGNOSIS — J3089 Other allergic rhinitis: Secondary | ICD-10-CM | POA: Diagnosis not present

## 2019-02-22 DIAGNOSIS — J3081 Allergic rhinitis due to animal (cat) (dog) hair and dander: Secondary | ICD-10-CM | POA: Diagnosis not present

## 2019-02-22 DIAGNOSIS — J301 Allergic rhinitis due to pollen: Secondary | ICD-10-CM | POA: Diagnosis not present

## 2019-02-26 DIAGNOSIS — J301 Allergic rhinitis due to pollen: Secondary | ICD-10-CM | POA: Diagnosis not present

## 2019-02-26 DIAGNOSIS — J3081 Allergic rhinitis due to animal (cat) (dog) hair and dander: Secondary | ICD-10-CM | POA: Diagnosis not present

## 2019-02-26 DIAGNOSIS — J320 Chronic maxillary sinusitis: Secondary | ICD-10-CM | POA: Diagnosis not present

## 2019-02-26 DIAGNOSIS — J322 Chronic ethmoidal sinusitis: Secondary | ICD-10-CM | POA: Diagnosis not present

## 2019-02-26 DIAGNOSIS — J3089 Other allergic rhinitis: Secondary | ICD-10-CM | POA: Diagnosis not present

## 2019-02-26 DIAGNOSIS — J321 Chronic frontal sinusitis: Secondary | ICD-10-CM | POA: Diagnosis not present

## 2019-02-26 DIAGNOSIS — J338 Other polyp of sinus: Secondary | ICD-10-CM | POA: Diagnosis not present

## 2019-03-02 DIAGNOSIS — J301 Allergic rhinitis due to pollen: Secondary | ICD-10-CM | POA: Diagnosis not present

## 2019-03-02 DIAGNOSIS — J3089 Other allergic rhinitis: Secondary | ICD-10-CM | POA: Diagnosis not present

## 2019-03-18 ENCOUNTER — Ambulatory Visit (INDEPENDENT_AMBULATORY_CARE_PROVIDER_SITE_OTHER): Payer: BC Managed Care – PPO | Admitting: Otolaryngology

## 2019-03-18 DIAGNOSIS — J322 Chronic ethmoidal sinusitis: Secondary | ICD-10-CM | POA: Diagnosis not present

## 2019-03-18 DIAGNOSIS — J338 Other polyp of sinus: Secondary | ICD-10-CM | POA: Diagnosis not present

## 2019-03-18 DIAGNOSIS — J320 Chronic maxillary sinusitis: Secondary | ICD-10-CM

## 2019-03-23 DIAGNOSIS — J301 Allergic rhinitis due to pollen: Secondary | ICD-10-CM | POA: Diagnosis not present

## 2019-03-23 DIAGNOSIS — J3081 Allergic rhinitis due to animal (cat) (dog) hair and dander: Secondary | ICD-10-CM | POA: Diagnosis not present

## 2019-03-23 DIAGNOSIS — J3089 Other allergic rhinitis: Secondary | ICD-10-CM | POA: Diagnosis not present

## 2019-04-05 DIAGNOSIS — J3081 Allergic rhinitis due to animal (cat) (dog) hair and dander: Secondary | ICD-10-CM | POA: Diagnosis not present

## 2019-04-05 DIAGNOSIS — J301 Allergic rhinitis due to pollen: Secondary | ICD-10-CM | POA: Diagnosis not present

## 2019-04-05 DIAGNOSIS — J3089 Other allergic rhinitis: Secondary | ICD-10-CM | POA: Diagnosis not present

## 2019-04-09 DIAGNOSIS — J321 Chronic frontal sinusitis: Secondary | ICD-10-CM | POA: Diagnosis not present

## 2019-04-09 DIAGNOSIS — J322 Chronic ethmoidal sinusitis: Secondary | ICD-10-CM | POA: Diagnosis not present

## 2019-04-09 DIAGNOSIS — J338 Other polyp of sinus: Secondary | ICD-10-CM | POA: Diagnosis not present

## 2019-04-09 DIAGNOSIS — J320 Chronic maxillary sinusitis: Secondary | ICD-10-CM | POA: Diagnosis not present

## 2019-04-12 DIAGNOSIS — J3089 Other allergic rhinitis: Secondary | ICD-10-CM | POA: Diagnosis not present

## 2019-04-12 DIAGNOSIS — J301 Allergic rhinitis due to pollen: Secondary | ICD-10-CM | POA: Diagnosis not present

## 2019-04-12 DIAGNOSIS — J3081 Allergic rhinitis due to animal (cat) (dog) hair and dander: Secondary | ICD-10-CM | POA: Diagnosis not present

## 2019-04-15 DIAGNOSIS — J3089 Other allergic rhinitis: Secondary | ICD-10-CM | POA: Diagnosis not present

## 2019-04-15 DIAGNOSIS — J301 Allergic rhinitis due to pollen: Secondary | ICD-10-CM | POA: Diagnosis not present

## 2019-04-15 DIAGNOSIS — J3081 Allergic rhinitis due to animal (cat) (dog) hair and dander: Secondary | ICD-10-CM | POA: Diagnosis not present

## 2019-04-22 DIAGNOSIS — J3081 Allergic rhinitis due to animal (cat) (dog) hair and dander: Secondary | ICD-10-CM | POA: Diagnosis not present

## 2019-04-22 DIAGNOSIS — H1045 Other chronic allergic conjunctivitis: Secondary | ICD-10-CM | POA: Diagnosis not present

## 2019-04-22 DIAGNOSIS — J3089 Other allergic rhinitis: Secondary | ICD-10-CM | POA: Diagnosis not present

## 2019-04-22 DIAGNOSIS — J301 Allergic rhinitis due to pollen: Secondary | ICD-10-CM | POA: Diagnosis not present

## 2019-04-23 DIAGNOSIS — J301 Allergic rhinitis due to pollen: Secondary | ICD-10-CM | POA: Diagnosis not present

## 2019-04-23 DIAGNOSIS — J3089 Other allergic rhinitis: Secondary | ICD-10-CM | POA: Diagnosis not present

## 2019-04-23 DIAGNOSIS — J3081 Allergic rhinitis due to animal (cat) (dog) hair and dander: Secondary | ICD-10-CM | POA: Diagnosis not present

## 2019-04-28 DIAGNOSIS — J320 Chronic maxillary sinusitis: Secondary | ICD-10-CM | POA: Diagnosis not present

## 2019-04-28 DIAGNOSIS — J322 Chronic ethmoidal sinusitis: Secondary | ICD-10-CM | POA: Diagnosis not present

## 2019-04-28 DIAGNOSIS — J338 Other polyp of sinus: Secondary | ICD-10-CM | POA: Diagnosis not present

## 2019-04-28 DIAGNOSIS — J321 Chronic frontal sinusitis: Secondary | ICD-10-CM | POA: Diagnosis not present

## 2019-05-03 DIAGNOSIS — J3089 Other allergic rhinitis: Secondary | ICD-10-CM | POA: Diagnosis not present

## 2019-05-03 DIAGNOSIS — J3081 Allergic rhinitis due to animal (cat) (dog) hair and dander: Secondary | ICD-10-CM | POA: Diagnosis not present

## 2019-05-03 DIAGNOSIS — J301 Allergic rhinitis due to pollen: Secondary | ICD-10-CM | POA: Diagnosis not present

## 2019-05-14 DIAGNOSIS — J3089 Other allergic rhinitis: Secondary | ICD-10-CM | POA: Diagnosis not present

## 2019-05-14 DIAGNOSIS — J3081 Allergic rhinitis due to animal (cat) (dog) hair and dander: Secondary | ICD-10-CM | POA: Diagnosis not present

## 2019-05-14 DIAGNOSIS — J301 Allergic rhinitis due to pollen: Secondary | ICD-10-CM | POA: Diagnosis not present

## 2019-05-18 DIAGNOSIS — N951 Menopausal and female climacteric states: Secondary | ICD-10-CM | POA: Diagnosis not present

## 2019-05-18 DIAGNOSIS — R232 Flushing: Secondary | ICD-10-CM | POA: Diagnosis not present

## 2019-05-18 DIAGNOSIS — R6882 Decreased libido: Secondary | ICD-10-CM | POA: Diagnosis not present

## 2019-05-20 DIAGNOSIS — N951 Menopausal and female climacteric states: Secondary | ICD-10-CM | POA: Diagnosis not present

## 2019-05-20 DIAGNOSIS — R6882 Decreased libido: Secondary | ICD-10-CM | POA: Diagnosis not present

## 2019-05-20 DIAGNOSIS — Z72820 Sleep deprivation: Secondary | ICD-10-CM | POA: Diagnosis not present

## 2019-05-21 DIAGNOSIS — J3081 Allergic rhinitis due to animal (cat) (dog) hair and dander: Secondary | ICD-10-CM | POA: Diagnosis not present

## 2019-05-21 DIAGNOSIS — J3089 Other allergic rhinitis: Secondary | ICD-10-CM | POA: Diagnosis not present

## 2019-05-21 DIAGNOSIS — J301 Allergic rhinitis due to pollen: Secondary | ICD-10-CM | POA: Diagnosis not present

## 2019-06-02 DIAGNOSIS — J321 Chronic frontal sinusitis: Secondary | ICD-10-CM | POA: Diagnosis not present

## 2019-06-02 DIAGNOSIS — J338 Other polyp of sinus: Secondary | ICD-10-CM | POA: Diagnosis not present

## 2019-06-02 DIAGNOSIS — J320 Chronic maxillary sinusitis: Secondary | ICD-10-CM | POA: Diagnosis not present

## 2019-06-02 DIAGNOSIS — J322 Chronic ethmoidal sinusitis: Secondary | ICD-10-CM | POA: Diagnosis not present

## 2019-06-07 DIAGNOSIS — Z1231 Encounter for screening mammogram for malignant neoplasm of breast: Secondary | ICD-10-CM | POA: Diagnosis not present

## 2019-06-07 DIAGNOSIS — Z803 Family history of malignant neoplasm of breast: Secondary | ICD-10-CM | POA: Diagnosis not present

## 2019-06-11 DIAGNOSIS — J3081 Allergic rhinitis due to animal (cat) (dog) hair and dander: Secondary | ICD-10-CM | POA: Diagnosis not present

## 2019-06-11 DIAGNOSIS — J301 Allergic rhinitis due to pollen: Secondary | ICD-10-CM | POA: Diagnosis not present

## 2019-06-11 DIAGNOSIS — J3089 Other allergic rhinitis: Secondary | ICD-10-CM | POA: Diagnosis not present

## 2019-06-18 DIAGNOSIS — J3081 Allergic rhinitis due to animal (cat) (dog) hair and dander: Secondary | ICD-10-CM | POA: Diagnosis not present

## 2019-06-18 DIAGNOSIS — J3089 Other allergic rhinitis: Secondary | ICD-10-CM | POA: Diagnosis not present

## 2019-06-18 DIAGNOSIS — J301 Allergic rhinitis due to pollen: Secondary | ICD-10-CM | POA: Diagnosis not present

## 2019-06-25 DIAGNOSIS — J3081 Allergic rhinitis due to animal (cat) (dog) hair and dander: Secondary | ICD-10-CM | POA: Diagnosis not present

## 2019-06-25 DIAGNOSIS — J3089 Other allergic rhinitis: Secondary | ICD-10-CM | POA: Diagnosis not present

## 2019-06-25 DIAGNOSIS — J301 Allergic rhinitis due to pollen: Secondary | ICD-10-CM | POA: Diagnosis not present

## 2019-07-02 DIAGNOSIS — J3081 Allergic rhinitis due to animal (cat) (dog) hair and dander: Secondary | ICD-10-CM | POA: Diagnosis not present

## 2019-07-02 DIAGNOSIS — J3089 Other allergic rhinitis: Secondary | ICD-10-CM | POA: Diagnosis not present

## 2019-07-02 DIAGNOSIS — J301 Allergic rhinitis due to pollen: Secondary | ICD-10-CM | POA: Diagnosis not present

## 2019-07-23 DIAGNOSIS — J301 Allergic rhinitis due to pollen: Secondary | ICD-10-CM | POA: Diagnosis not present

## 2019-07-23 DIAGNOSIS — J3081 Allergic rhinitis due to animal (cat) (dog) hair and dander: Secondary | ICD-10-CM | POA: Diagnosis not present

## 2019-07-23 DIAGNOSIS — J3089 Other allergic rhinitis: Secondary | ICD-10-CM | POA: Diagnosis not present

## 2019-07-30 DIAGNOSIS — J301 Allergic rhinitis due to pollen: Secondary | ICD-10-CM | POA: Diagnosis not present

## 2019-07-30 DIAGNOSIS — J3089 Other allergic rhinitis: Secondary | ICD-10-CM | POA: Diagnosis not present

## 2019-07-30 DIAGNOSIS — J3081 Allergic rhinitis due to animal (cat) (dog) hair and dander: Secondary | ICD-10-CM | POA: Diagnosis not present

## 2019-08-11 DIAGNOSIS — J301 Allergic rhinitis due to pollen: Secondary | ICD-10-CM | POA: Diagnosis not present

## 2019-08-11 DIAGNOSIS — J3081 Allergic rhinitis due to animal (cat) (dog) hair and dander: Secondary | ICD-10-CM | POA: Diagnosis not present

## 2019-08-11 DIAGNOSIS — J3089 Other allergic rhinitis: Secondary | ICD-10-CM | POA: Diagnosis not present

## 2019-08-18 DIAGNOSIS — J3089 Other allergic rhinitis: Secondary | ICD-10-CM | POA: Diagnosis not present

## 2019-08-18 DIAGNOSIS — J3081 Allergic rhinitis due to animal (cat) (dog) hair and dander: Secondary | ICD-10-CM | POA: Diagnosis not present

## 2019-08-18 DIAGNOSIS — J301 Allergic rhinitis due to pollen: Secondary | ICD-10-CM | POA: Diagnosis not present

## 2019-08-19 DIAGNOSIS — J3089 Other allergic rhinitis: Secondary | ICD-10-CM | POA: Diagnosis not present

## 2019-08-25 DIAGNOSIS — J322 Chronic ethmoidal sinusitis: Secondary | ICD-10-CM | POA: Diagnosis not present

## 2019-08-25 DIAGNOSIS — J301 Allergic rhinitis due to pollen: Secondary | ICD-10-CM | POA: Diagnosis not present

## 2019-08-25 DIAGNOSIS — J3089 Other allergic rhinitis: Secondary | ICD-10-CM | POA: Diagnosis not present

## 2019-08-25 DIAGNOSIS — J3081 Allergic rhinitis due to animal (cat) (dog) hair and dander: Secondary | ICD-10-CM | POA: Diagnosis not present

## 2019-08-25 DIAGNOSIS — J321 Chronic frontal sinusitis: Secondary | ICD-10-CM | POA: Diagnosis not present

## 2019-08-25 DIAGNOSIS — J320 Chronic maxillary sinusitis: Secondary | ICD-10-CM | POA: Diagnosis not present

## 2019-08-25 DIAGNOSIS — J338 Other polyp of sinus: Secondary | ICD-10-CM | POA: Diagnosis not present

## 2019-08-29 DIAGNOSIS — Z20822 Contact with and (suspected) exposure to covid-19: Secondary | ICD-10-CM | POA: Diagnosis not present

## 2019-08-29 DIAGNOSIS — J3489 Other specified disorders of nose and nasal sinuses: Secondary | ICD-10-CM | POA: Diagnosis not present

## 2019-08-29 DIAGNOSIS — R519 Headache, unspecified: Secondary | ICD-10-CM | POA: Diagnosis not present

## 2019-09-01 DIAGNOSIS — J3089 Other allergic rhinitis: Secondary | ICD-10-CM | POA: Diagnosis not present

## 2019-09-01 DIAGNOSIS — J3081 Allergic rhinitis due to animal (cat) (dog) hair and dander: Secondary | ICD-10-CM | POA: Diagnosis not present

## 2019-09-01 DIAGNOSIS — J301 Allergic rhinitis due to pollen: Secondary | ICD-10-CM | POA: Diagnosis not present

## 2019-09-08 DIAGNOSIS — J301 Allergic rhinitis due to pollen: Secondary | ICD-10-CM | POA: Diagnosis not present

## 2019-09-08 DIAGNOSIS — J3081 Allergic rhinitis due to animal (cat) (dog) hair and dander: Secondary | ICD-10-CM | POA: Diagnosis not present

## 2019-09-08 DIAGNOSIS — J3089 Other allergic rhinitis: Secondary | ICD-10-CM | POA: Diagnosis not present

## 2019-09-22 DIAGNOSIS — J3089 Other allergic rhinitis: Secondary | ICD-10-CM | POA: Diagnosis not present

## 2019-09-22 DIAGNOSIS — J301 Allergic rhinitis due to pollen: Secondary | ICD-10-CM | POA: Diagnosis not present

## 2019-09-22 DIAGNOSIS — J3081 Allergic rhinitis due to animal (cat) (dog) hair and dander: Secondary | ICD-10-CM | POA: Diagnosis not present

## 2019-09-28 DIAGNOSIS — J3081 Allergic rhinitis due to animal (cat) (dog) hair and dander: Secondary | ICD-10-CM | POA: Diagnosis not present

## 2019-09-28 DIAGNOSIS — J301 Allergic rhinitis due to pollen: Secondary | ICD-10-CM | POA: Diagnosis not present

## 2019-09-28 DIAGNOSIS — J3089 Other allergic rhinitis: Secondary | ICD-10-CM | POA: Diagnosis not present

## 2019-10-05 DIAGNOSIS — J3081 Allergic rhinitis due to animal (cat) (dog) hair and dander: Secondary | ICD-10-CM | POA: Diagnosis not present

## 2019-10-05 DIAGNOSIS — J301 Allergic rhinitis due to pollen: Secondary | ICD-10-CM | POA: Diagnosis not present

## 2019-10-05 DIAGNOSIS — J3089 Other allergic rhinitis: Secondary | ICD-10-CM | POA: Diagnosis not present

## 2019-10-21 DIAGNOSIS — J3089 Other allergic rhinitis: Secondary | ICD-10-CM | POA: Diagnosis not present

## 2019-10-21 DIAGNOSIS — J301 Allergic rhinitis due to pollen: Secondary | ICD-10-CM | POA: Diagnosis not present

## 2019-10-21 DIAGNOSIS — J3081 Allergic rhinitis due to animal (cat) (dog) hair and dander: Secondary | ICD-10-CM | POA: Diagnosis not present

## 2019-10-27 DIAGNOSIS — J3081 Allergic rhinitis due to animal (cat) (dog) hair and dander: Secondary | ICD-10-CM | POA: Diagnosis not present

## 2019-10-27 DIAGNOSIS — J301 Allergic rhinitis due to pollen: Secondary | ICD-10-CM | POA: Diagnosis not present

## 2019-10-27 DIAGNOSIS — J3089 Other allergic rhinitis: Secondary | ICD-10-CM | POA: Diagnosis not present

## 2019-11-03 DIAGNOSIS — J301 Allergic rhinitis due to pollen: Secondary | ICD-10-CM | POA: Diagnosis not present

## 2019-11-03 DIAGNOSIS — J3089 Other allergic rhinitis: Secondary | ICD-10-CM | POA: Diagnosis not present

## 2019-11-03 DIAGNOSIS — J3081 Allergic rhinitis due to animal (cat) (dog) hair and dander: Secondary | ICD-10-CM | POA: Diagnosis not present

## 2019-11-18 DIAGNOSIS — J3081 Allergic rhinitis due to animal (cat) (dog) hair and dander: Secondary | ICD-10-CM | POA: Diagnosis not present

## 2019-11-18 DIAGNOSIS — J3089 Other allergic rhinitis: Secondary | ICD-10-CM | POA: Diagnosis not present

## 2019-11-18 DIAGNOSIS — J301 Allergic rhinitis due to pollen: Secondary | ICD-10-CM | POA: Diagnosis not present

## 2019-11-25 DIAGNOSIS — J301 Allergic rhinitis due to pollen: Secondary | ICD-10-CM | POA: Diagnosis not present

## 2019-11-25 DIAGNOSIS — J3081 Allergic rhinitis due to animal (cat) (dog) hair and dander: Secondary | ICD-10-CM | POA: Diagnosis not present

## 2019-11-25 DIAGNOSIS — J3089 Other allergic rhinitis: Secondary | ICD-10-CM | POA: Diagnosis not present

## 2019-12-06 DIAGNOSIS — J301 Allergic rhinitis due to pollen: Secondary | ICD-10-CM | POA: Diagnosis not present

## 2019-12-06 DIAGNOSIS — J3089 Other allergic rhinitis: Secondary | ICD-10-CM | POA: Diagnosis not present

## 2019-12-06 DIAGNOSIS — J3081 Allergic rhinitis due to animal (cat) (dog) hair and dander: Secondary | ICD-10-CM | POA: Diagnosis not present

## 2019-12-08 DIAGNOSIS — J3081 Allergic rhinitis due to animal (cat) (dog) hair and dander: Secondary | ICD-10-CM | POA: Diagnosis not present

## 2019-12-08 DIAGNOSIS — J301 Allergic rhinitis due to pollen: Secondary | ICD-10-CM | POA: Diagnosis not present

## 2019-12-08 DIAGNOSIS — J3089 Other allergic rhinitis: Secondary | ICD-10-CM | POA: Diagnosis not present

## 2019-12-08 DIAGNOSIS — H1045 Other chronic allergic conjunctivitis: Secondary | ICD-10-CM | POA: Diagnosis not present

## 2019-12-13 DIAGNOSIS — J3089 Other allergic rhinitis: Secondary | ICD-10-CM | POA: Diagnosis not present

## 2019-12-13 DIAGNOSIS — J301 Allergic rhinitis due to pollen: Secondary | ICD-10-CM | POA: Diagnosis not present

## 2019-12-13 DIAGNOSIS — J3081 Allergic rhinitis due to animal (cat) (dog) hair and dander: Secondary | ICD-10-CM | POA: Diagnosis not present

## 2020-01-05 DIAGNOSIS — L821 Other seborrheic keratosis: Secondary | ICD-10-CM | POA: Diagnosis not present

## 2020-01-10 DIAGNOSIS — J3089 Other allergic rhinitis: Secondary | ICD-10-CM | POA: Diagnosis not present

## 2020-01-10 DIAGNOSIS — J301 Allergic rhinitis due to pollen: Secondary | ICD-10-CM | POA: Diagnosis not present

## 2020-01-10 DIAGNOSIS — J3081 Allergic rhinitis due to animal (cat) (dog) hair and dander: Secondary | ICD-10-CM | POA: Diagnosis not present

## 2020-01-24 DIAGNOSIS — J3089 Other allergic rhinitis: Secondary | ICD-10-CM | POA: Diagnosis not present

## 2020-01-24 DIAGNOSIS — J3081 Allergic rhinitis due to animal (cat) (dog) hair and dander: Secondary | ICD-10-CM | POA: Diagnosis not present

## 2020-01-24 DIAGNOSIS — J301 Allergic rhinitis due to pollen: Secondary | ICD-10-CM | POA: Diagnosis not present

## 2020-01-28 DIAGNOSIS — J3081 Allergic rhinitis due to animal (cat) (dog) hair and dander: Secondary | ICD-10-CM | POA: Diagnosis not present

## 2020-01-28 DIAGNOSIS — J301 Allergic rhinitis due to pollen: Secondary | ICD-10-CM | POA: Diagnosis not present

## 2020-01-28 DIAGNOSIS — J3089 Other allergic rhinitis: Secondary | ICD-10-CM | POA: Diagnosis not present

## 2020-02-02 DIAGNOSIS — J301 Allergic rhinitis due to pollen: Secondary | ICD-10-CM | POA: Diagnosis not present

## 2020-02-02 DIAGNOSIS — J3081 Allergic rhinitis due to animal (cat) (dog) hair and dander: Secondary | ICD-10-CM | POA: Diagnosis not present

## 2020-02-11 DIAGNOSIS — J301 Allergic rhinitis due to pollen: Secondary | ICD-10-CM | POA: Diagnosis not present

## 2020-02-11 DIAGNOSIS — J3081 Allergic rhinitis due to animal (cat) (dog) hair and dander: Secondary | ICD-10-CM | POA: Diagnosis not present

## 2020-02-11 DIAGNOSIS — J3089 Other allergic rhinitis: Secondary | ICD-10-CM | POA: Diagnosis not present

## 2020-02-16 DIAGNOSIS — J301 Allergic rhinitis due to pollen: Secondary | ICD-10-CM | POA: Diagnosis not present

## 2020-02-16 DIAGNOSIS — J3081 Allergic rhinitis due to animal (cat) (dog) hair and dander: Secondary | ICD-10-CM | POA: Diagnosis not present

## 2020-02-16 DIAGNOSIS — J3089 Other allergic rhinitis: Secondary | ICD-10-CM | POA: Diagnosis not present

## 2020-02-22 DIAGNOSIS — F411 Generalized anxiety disorder: Secondary | ICD-10-CM | POA: Diagnosis not present

## 2020-02-28 DIAGNOSIS — F411 Generalized anxiety disorder: Secondary | ICD-10-CM | POA: Diagnosis not present

## 2020-03-01 DIAGNOSIS — J3089 Other allergic rhinitis: Secondary | ICD-10-CM | POA: Diagnosis not present

## 2020-03-01 DIAGNOSIS — J301 Allergic rhinitis due to pollen: Secondary | ICD-10-CM | POA: Diagnosis not present

## 2020-03-01 DIAGNOSIS — J3081 Allergic rhinitis due to animal (cat) (dog) hair and dander: Secondary | ICD-10-CM | POA: Diagnosis not present

## 2020-03-02 DIAGNOSIS — F411 Generalized anxiety disorder: Secondary | ICD-10-CM | POA: Diagnosis not present

## 2020-03-08 DIAGNOSIS — J3089 Other allergic rhinitis: Secondary | ICD-10-CM | POA: Diagnosis not present

## 2020-03-08 DIAGNOSIS — J3081 Allergic rhinitis due to animal (cat) (dog) hair and dander: Secondary | ICD-10-CM | POA: Diagnosis not present

## 2020-03-08 DIAGNOSIS — J301 Allergic rhinitis due to pollen: Secondary | ICD-10-CM | POA: Diagnosis not present

## 2020-03-14 DIAGNOSIS — F411 Generalized anxiety disorder: Secondary | ICD-10-CM | POA: Diagnosis not present

## 2020-03-29 DIAGNOSIS — F411 Generalized anxiety disorder: Secondary | ICD-10-CM | POA: Diagnosis not present

## 2020-03-30 DIAGNOSIS — J3081 Allergic rhinitis due to animal (cat) (dog) hair and dander: Secondary | ICD-10-CM | POA: Diagnosis not present

## 2020-03-30 DIAGNOSIS — J301 Allergic rhinitis due to pollen: Secondary | ICD-10-CM | POA: Diagnosis not present

## 2020-03-30 DIAGNOSIS — J3089 Other allergic rhinitis: Secondary | ICD-10-CM | POA: Diagnosis not present

## 2020-04-03 DIAGNOSIS — F411 Generalized anxiety disorder: Secondary | ICD-10-CM | POA: Diagnosis not present

## 2020-04-04 DIAGNOSIS — J3089 Other allergic rhinitis: Secondary | ICD-10-CM | POA: Diagnosis not present

## 2020-04-04 DIAGNOSIS — J3081 Allergic rhinitis due to animal (cat) (dog) hair and dander: Secondary | ICD-10-CM | POA: Diagnosis not present

## 2020-04-04 DIAGNOSIS — J301 Allergic rhinitis due to pollen: Secondary | ICD-10-CM | POA: Diagnosis not present

## 2020-04-10 DIAGNOSIS — F411 Generalized anxiety disorder: Secondary | ICD-10-CM | POA: Diagnosis not present

## 2020-04-11 DIAGNOSIS — J301 Allergic rhinitis due to pollen: Secondary | ICD-10-CM | POA: Diagnosis not present

## 2020-04-11 DIAGNOSIS — J3081 Allergic rhinitis due to animal (cat) (dog) hair and dander: Secondary | ICD-10-CM | POA: Diagnosis not present

## 2020-04-11 DIAGNOSIS — J3089 Other allergic rhinitis: Secondary | ICD-10-CM | POA: Diagnosis not present

## 2020-04-18 DIAGNOSIS — J3089 Other allergic rhinitis: Secondary | ICD-10-CM | POA: Diagnosis not present

## 2020-04-18 DIAGNOSIS — J301 Allergic rhinitis due to pollen: Secondary | ICD-10-CM | POA: Diagnosis not present

## 2020-04-18 DIAGNOSIS — J3081 Allergic rhinitis due to animal (cat) (dog) hair and dander: Secondary | ICD-10-CM | POA: Diagnosis not present

## 2020-04-19 DIAGNOSIS — J3089 Other allergic rhinitis: Secondary | ICD-10-CM | POA: Diagnosis not present

## 2020-04-20 IMAGING — CT CT MAXILLOFACIAL W/O CM
3 of 4 series · 13 of 47 positions shown, 15 images · non-contrast
Comparison: None.

CLINICAL DATA: Fusion protocol. Chronic sinusitis.

EXAM:
CT MAXILLOFACIAL WITHOUT CONTRAST
TECHNIQUE: Multidetector CT images of the paranasal sinuses were obtained using
the standard protocol without intravenous contrast.

[Series 2: sinus 2.00 hr60 s3 ax · axial · 0.36mm/px · z∈[-635,-483]mm · 7 of 90 slices shown, 9 images]
[im 7/90  brain]
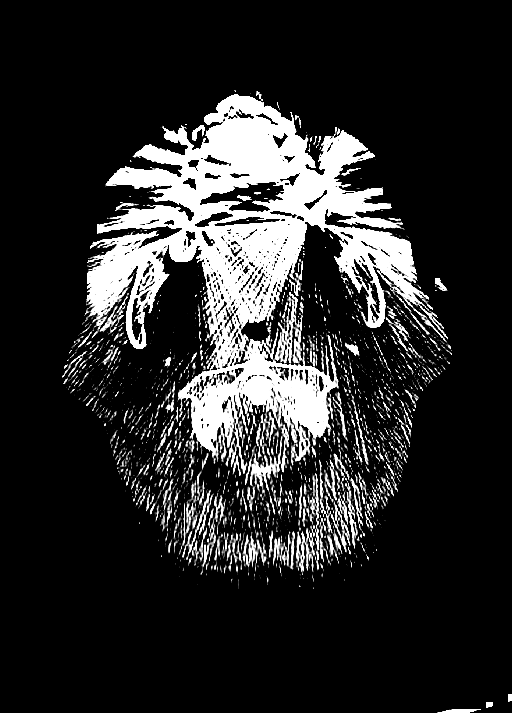
[im 7/90  bone]
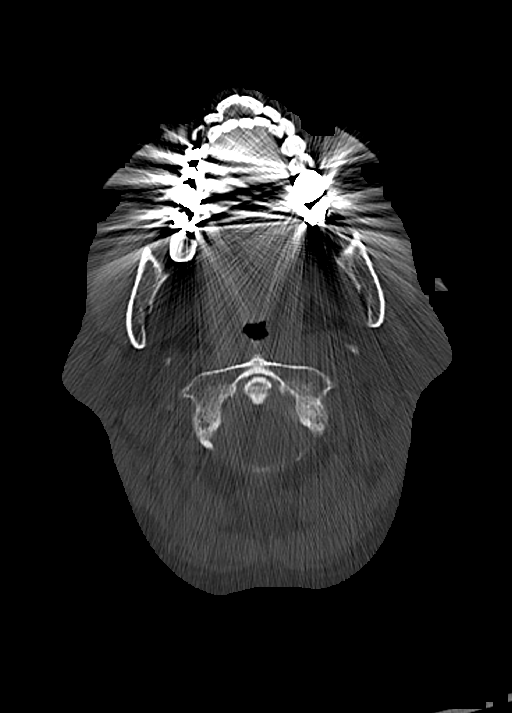
[im 20/90  bone]
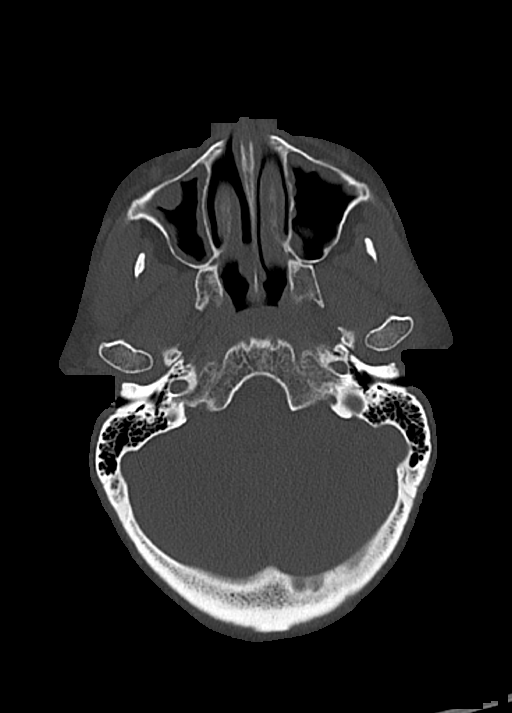
[im 32/90  bone]
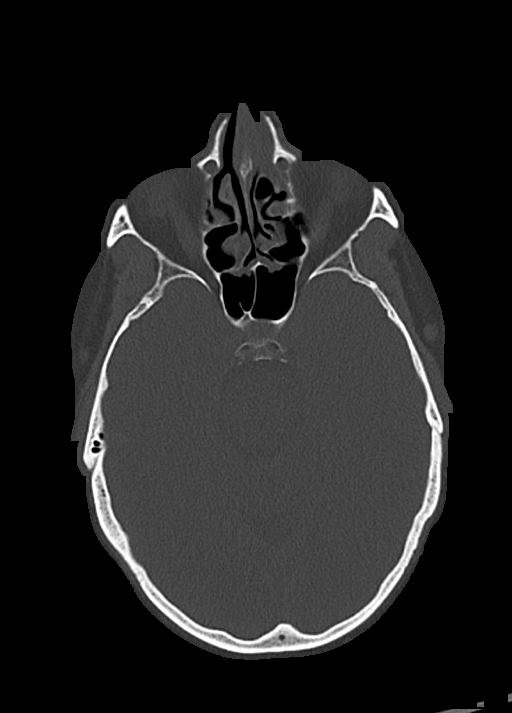
[im 45/90  bone]
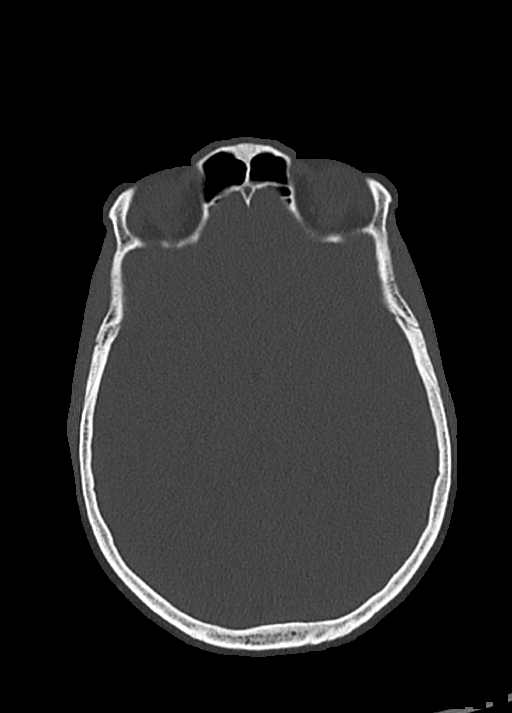
[im 58/90  brain]
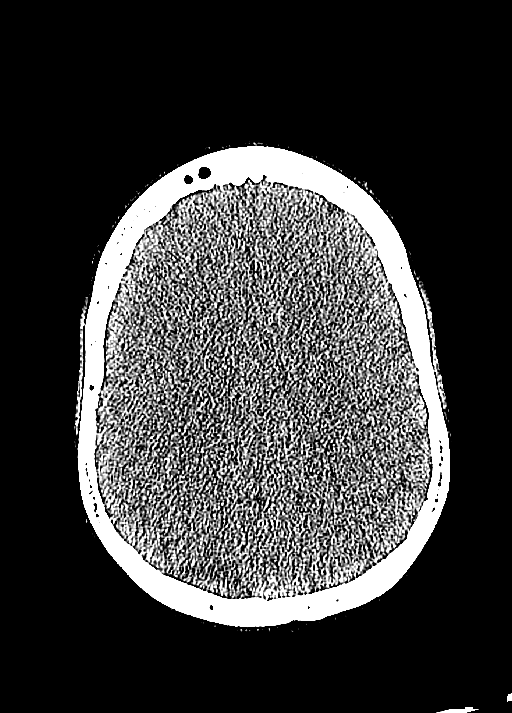
[im 58/90  bone]
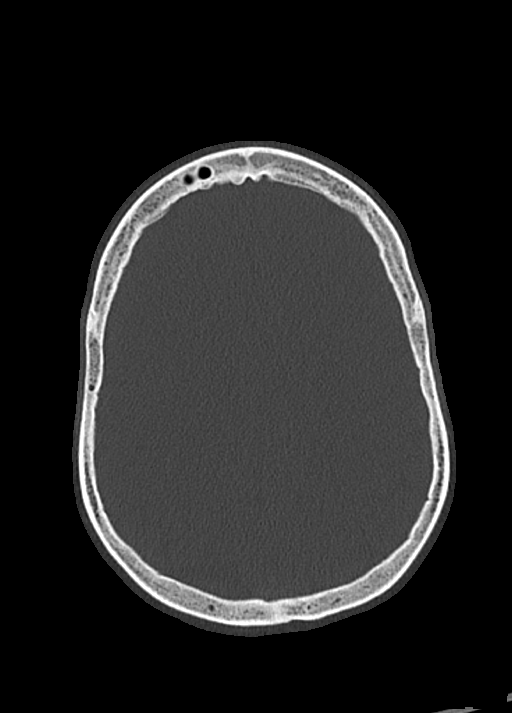
[im 70/90  bone]
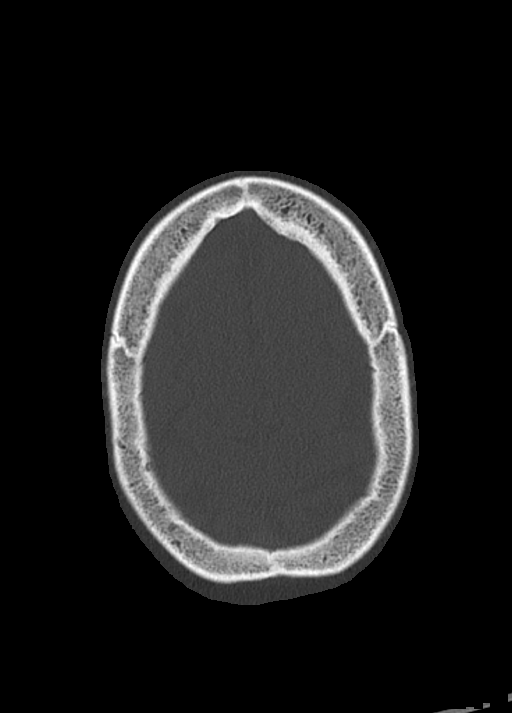
[im 83/90  bone]
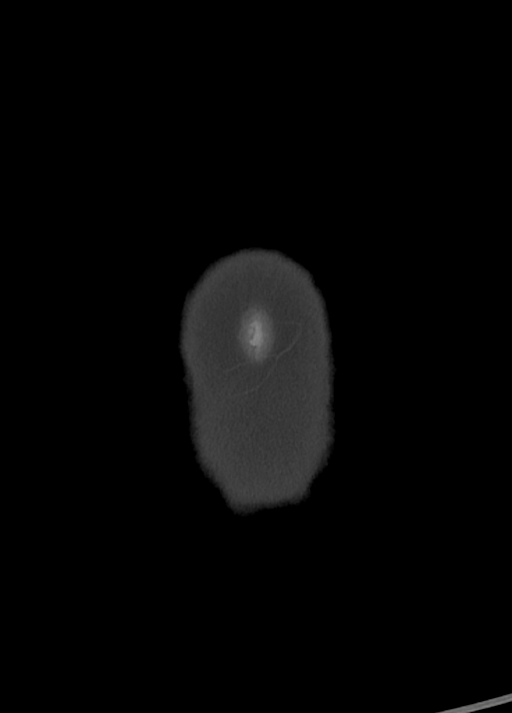

[Series 4: sinus 2.00 hr60 s3 cor · coronal · 0.35mm/px · 3 of 128 slices shown]
[im 43/128  bone]
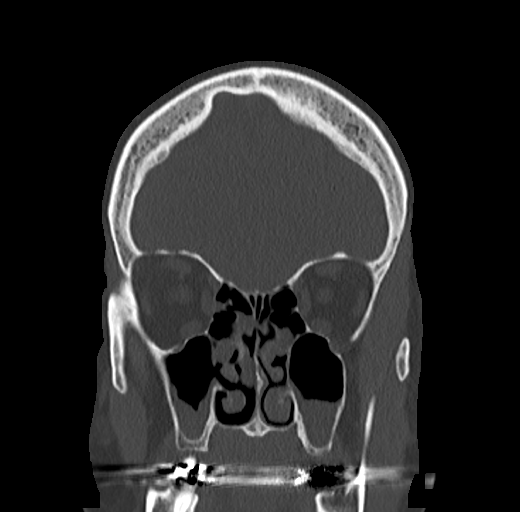
[im 57/128  bone]
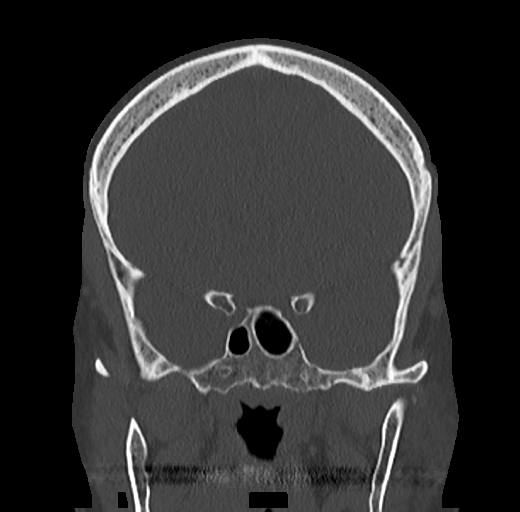
[im 71/128  bone]
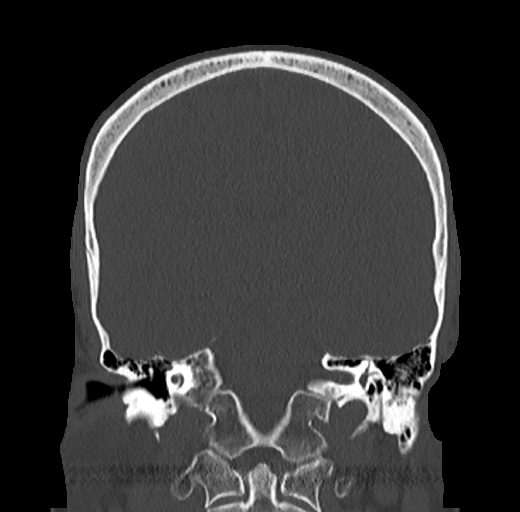

[Series 6: sinus 2.00 hr60 s3 sag · sagittal · 0.35mm/px · 3 of 91 slices shown]
[im 31/91  bone]
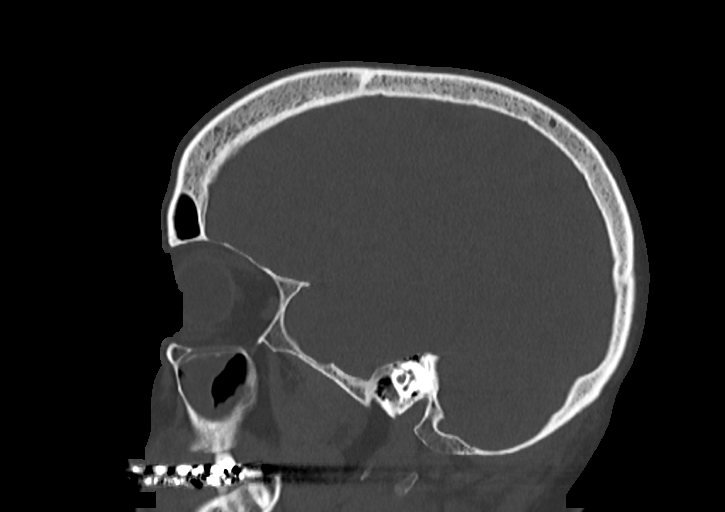
[im 46/91  bone]
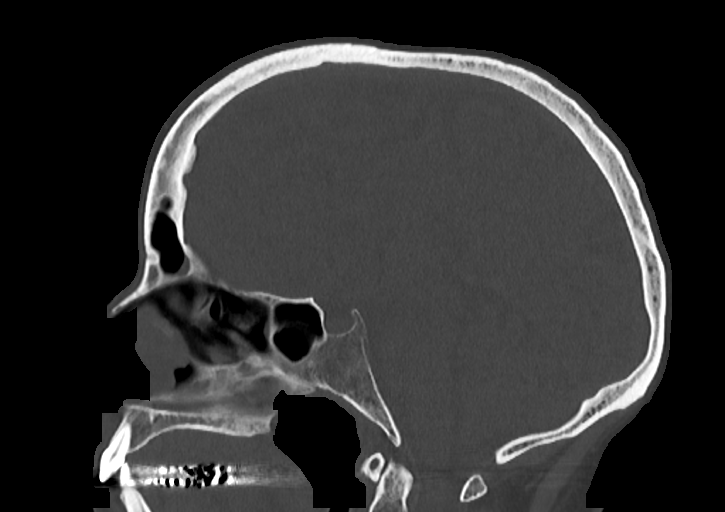
[im 61/91  bone]
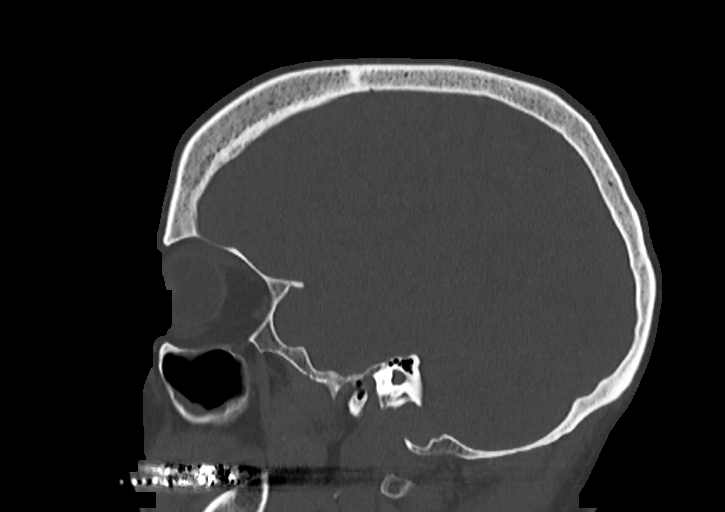

[13 of 47 positions shown; findings below may reference images not displayed]

FINDINGS: Paranasal sinuses:

Frontal: Minimal mucosal thickening at the LEFT frontal sinus
drainage pathway. No significant frontal sinus fluid.

Ethmoid: BILATERAL anterior and middle ethmoid mucosal thickening.

Maxillary: BILATERAL maxillary sinus mucosal thickening, probable
retention cyst formation. No definite layering fluid.

Sphenoid: Normally aerated. Patent sphenoethmoidal recesses.

Right ostiomeatal unit: Obstructed.

Left ostiomeatal unit: Obstructed.

Nasal passages: Narrowed on the LEFT. LEFT middle turbinate concha
bullosa. Intact nasal septum is midline.

Anatomy: No pneumatization superior to anterior ethmoid notches.
Symmetric and intact olfactory grooves and fovea ethmoidalis, [place]
I (1-3mm). Presellar sphenoid pneumatization pattern.

Other: Orbits and intracranial compartment are unremarkable. Visible
mastoid air cells are normally aerated.
IMPRESSION: Obstructed ostiomeatal units bilaterally. Mild chronic sinusitis.

## 2020-04-21 DIAGNOSIS — F411 Generalized anxiety disorder: Secondary | ICD-10-CM | POA: Diagnosis not present

## 2020-04-27 DIAGNOSIS — J3089 Other allergic rhinitis: Secondary | ICD-10-CM | POA: Diagnosis not present

## 2020-04-27 DIAGNOSIS — J3081 Allergic rhinitis due to animal (cat) (dog) hair and dander: Secondary | ICD-10-CM | POA: Diagnosis not present

## 2020-04-27 DIAGNOSIS — J301 Allergic rhinitis due to pollen: Secondary | ICD-10-CM | POA: Diagnosis not present

## 2020-04-28 DIAGNOSIS — F411 Generalized anxiety disorder: Secondary | ICD-10-CM | POA: Diagnosis not present

## 2020-05-05 DIAGNOSIS — F411 Generalized anxiety disorder: Secondary | ICD-10-CM | POA: Diagnosis not present

## 2020-05-10 DIAGNOSIS — J301 Allergic rhinitis due to pollen: Secondary | ICD-10-CM | POA: Diagnosis not present

## 2020-05-10 DIAGNOSIS — J3081 Allergic rhinitis due to animal (cat) (dog) hair and dander: Secondary | ICD-10-CM | POA: Diagnosis not present

## 2020-05-10 DIAGNOSIS — J3089 Other allergic rhinitis: Secondary | ICD-10-CM | POA: Diagnosis not present

## 2020-05-12 DIAGNOSIS — F411 Generalized anxiety disorder: Secondary | ICD-10-CM | POA: Diagnosis not present

## 2020-05-18 DIAGNOSIS — J3089 Other allergic rhinitis: Secondary | ICD-10-CM | POA: Diagnosis not present

## 2020-05-18 DIAGNOSIS — J3081 Allergic rhinitis due to animal (cat) (dog) hair and dander: Secondary | ICD-10-CM | POA: Diagnosis not present

## 2020-05-18 DIAGNOSIS — J301 Allergic rhinitis due to pollen: Secondary | ICD-10-CM | POA: Diagnosis not present

## 2020-05-29 DIAGNOSIS — J301 Allergic rhinitis due to pollen: Secondary | ICD-10-CM | POA: Diagnosis not present

## 2020-05-29 DIAGNOSIS — J3089 Other allergic rhinitis: Secondary | ICD-10-CM | POA: Diagnosis not present

## 2020-05-29 DIAGNOSIS — J3081 Allergic rhinitis due to animal (cat) (dog) hair and dander: Secondary | ICD-10-CM | POA: Diagnosis not present

## 2020-06-02 DIAGNOSIS — U071 COVID-19: Secondary | ICD-10-CM | POA: Diagnosis not present

## 2020-06-13 DIAGNOSIS — Z1231 Encounter for screening mammogram for malignant neoplasm of breast: Secondary | ICD-10-CM | POA: Diagnosis not present

## 2020-06-16 DIAGNOSIS — J3081 Allergic rhinitis due to animal (cat) (dog) hair and dander: Secondary | ICD-10-CM | POA: Diagnosis not present

## 2020-06-16 DIAGNOSIS — Z0000 Encounter for general adult medical examination without abnormal findings: Secondary | ICD-10-CM | POA: Diagnosis not present

## 2020-06-16 DIAGNOSIS — Z23 Encounter for immunization: Secondary | ICD-10-CM | POA: Diagnosis not present

## 2020-06-16 DIAGNOSIS — J3089 Other allergic rhinitis: Secondary | ICD-10-CM | POA: Diagnosis not present

## 2020-06-16 DIAGNOSIS — Z13220 Encounter for screening for lipoid disorders: Secondary | ICD-10-CM | POA: Diagnosis not present

## 2020-06-16 DIAGNOSIS — J301 Allergic rhinitis due to pollen: Secondary | ICD-10-CM | POA: Diagnosis not present

## 2020-06-19 DIAGNOSIS — E782 Mixed hyperlipidemia: Secondary | ICD-10-CM | POA: Diagnosis not present

## 2020-06-19 DIAGNOSIS — H6123 Impacted cerumen, bilateral: Secondary | ICD-10-CM | POA: Diagnosis not present

## 2020-06-23 DIAGNOSIS — J301 Allergic rhinitis due to pollen: Secondary | ICD-10-CM | POA: Diagnosis not present

## 2020-06-23 DIAGNOSIS — J3089 Other allergic rhinitis: Secondary | ICD-10-CM | POA: Diagnosis not present

## 2020-06-23 DIAGNOSIS — J3081 Allergic rhinitis due to animal (cat) (dog) hair and dander: Secondary | ICD-10-CM | POA: Diagnosis not present

## 2020-07-12 DIAGNOSIS — J301 Allergic rhinitis due to pollen: Secondary | ICD-10-CM | POA: Diagnosis not present

## 2020-07-12 DIAGNOSIS — J3081 Allergic rhinitis due to animal (cat) (dog) hair and dander: Secondary | ICD-10-CM | POA: Diagnosis not present

## 2020-07-12 DIAGNOSIS — J3089 Other allergic rhinitis: Secondary | ICD-10-CM | POA: Diagnosis not present

## 2020-07-20 DIAGNOSIS — J301 Allergic rhinitis due to pollen: Secondary | ICD-10-CM | POA: Diagnosis not present

## 2020-07-20 DIAGNOSIS — J3081 Allergic rhinitis due to animal (cat) (dog) hair and dander: Secondary | ICD-10-CM | POA: Diagnosis not present

## 2020-07-20 DIAGNOSIS — J3089 Other allergic rhinitis: Secondary | ICD-10-CM | POA: Diagnosis not present

## 2020-07-27 DIAGNOSIS — J3081 Allergic rhinitis due to animal (cat) (dog) hair and dander: Secondary | ICD-10-CM | POA: Diagnosis not present

## 2020-07-27 DIAGNOSIS — J3089 Other allergic rhinitis: Secondary | ICD-10-CM | POA: Diagnosis not present

## 2020-07-27 DIAGNOSIS — J301 Allergic rhinitis due to pollen: Secondary | ICD-10-CM | POA: Diagnosis not present

## 2020-07-27 DIAGNOSIS — H1045 Other chronic allergic conjunctivitis: Secondary | ICD-10-CM | POA: Diagnosis not present

## 2020-08-09 DIAGNOSIS — J3089 Other allergic rhinitis: Secondary | ICD-10-CM | POA: Diagnosis not present

## 2020-08-09 DIAGNOSIS — J301 Allergic rhinitis due to pollen: Secondary | ICD-10-CM | POA: Diagnosis not present

## 2020-08-09 DIAGNOSIS — J3081 Allergic rhinitis due to animal (cat) (dog) hair and dander: Secondary | ICD-10-CM | POA: Diagnosis not present

## 2020-09-01 DIAGNOSIS — J3081 Allergic rhinitis due to animal (cat) (dog) hair and dander: Secondary | ICD-10-CM | POA: Diagnosis not present

## 2020-09-01 DIAGNOSIS — J301 Allergic rhinitis due to pollen: Secondary | ICD-10-CM | POA: Diagnosis not present

## 2020-09-01 DIAGNOSIS — J3089 Other allergic rhinitis: Secondary | ICD-10-CM | POA: Diagnosis not present

## 2020-09-15 DIAGNOSIS — J3089 Other allergic rhinitis: Secondary | ICD-10-CM | POA: Diagnosis not present

## 2020-09-15 DIAGNOSIS — J3081 Allergic rhinitis due to animal (cat) (dog) hair and dander: Secondary | ICD-10-CM | POA: Diagnosis not present

## 2020-09-15 DIAGNOSIS — J301 Allergic rhinitis due to pollen: Secondary | ICD-10-CM | POA: Diagnosis not present

## 2020-09-21 DIAGNOSIS — J3089 Other allergic rhinitis: Secondary | ICD-10-CM | POA: Diagnosis not present

## 2020-09-21 DIAGNOSIS — J301 Allergic rhinitis due to pollen: Secondary | ICD-10-CM | POA: Diagnosis not present

## 2020-09-21 DIAGNOSIS — J3081 Allergic rhinitis due to animal (cat) (dog) hair and dander: Secondary | ICD-10-CM | POA: Diagnosis not present

## 2020-09-29 DIAGNOSIS — J3089 Other allergic rhinitis: Secondary | ICD-10-CM | POA: Diagnosis not present

## 2020-09-29 DIAGNOSIS — J3081 Allergic rhinitis due to animal (cat) (dog) hair and dander: Secondary | ICD-10-CM | POA: Diagnosis not present

## 2020-09-29 DIAGNOSIS — J301 Allergic rhinitis due to pollen: Secondary | ICD-10-CM | POA: Diagnosis not present

## 2020-10-20 DIAGNOSIS — J301 Allergic rhinitis due to pollen: Secondary | ICD-10-CM | POA: Diagnosis not present

## 2020-10-20 DIAGNOSIS — J3081 Allergic rhinitis due to animal (cat) (dog) hair and dander: Secondary | ICD-10-CM | POA: Diagnosis not present

## 2020-10-20 DIAGNOSIS — J3089 Other allergic rhinitis: Secondary | ICD-10-CM | POA: Diagnosis not present

## 2020-11-03 DIAGNOSIS — J301 Allergic rhinitis due to pollen: Secondary | ICD-10-CM | POA: Diagnosis not present

## 2020-11-03 DIAGNOSIS — J3081 Allergic rhinitis due to animal (cat) (dog) hair and dander: Secondary | ICD-10-CM | POA: Diagnosis not present

## 2020-11-03 DIAGNOSIS — J3089 Other allergic rhinitis: Secondary | ICD-10-CM | POA: Diagnosis not present

## 2020-11-16 DIAGNOSIS — J3081 Allergic rhinitis due to animal (cat) (dog) hair and dander: Secondary | ICD-10-CM | POA: Diagnosis not present

## 2020-11-16 DIAGNOSIS — J301 Allergic rhinitis due to pollen: Secondary | ICD-10-CM | POA: Diagnosis not present

## 2020-11-16 DIAGNOSIS — J3089 Other allergic rhinitis: Secondary | ICD-10-CM | POA: Diagnosis not present

## 2020-11-24 DIAGNOSIS — J301 Allergic rhinitis due to pollen: Secondary | ICD-10-CM | POA: Diagnosis not present

## 2020-11-24 DIAGNOSIS — J3089 Other allergic rhinitis: Secondary | ICD-10-CM | POA: Diagnosis not present

## 2020-11-24 DIAGNOSIS — J3081 Allergic rhinitis due to animal (cat) (dog) hair and dander: Secondary | ICD-10-CM | POA: Diagnosis not present

## 2020-12-18 DIAGNOSIS — J3089 Other allergic rhinitis: Secondary | ICD-10-CM | POA: Diagnosis not present

## 2020-12-18 DIAGNOSIS — J3081 Allergic rhinitis due to animal (cat) (dog) hair and dander: Secondary | ICD-10-CM | POA: Diagnosis not present

## 2020-12-18 DIAGNOSIS — J301 Allergic rhinitis due to pollen: Secondary | ICD-10-CM | POA: Diagnosis not present

## 2020-12-25 DIAGNOSIS — J301 Allergic rhinitis due to pollen: Secondary | ICD-10-CM | POA: Diagnosis not present

## 2020-12-25 DIAGNOSIS — J3089 Other allergic rhinitis: Secondary | ICD-10-CM | POA: Diagnosis not present

## 2020-12-25 DIAGNOSIS — J3081 Allergic rhinitis due to animal (cat) (dog) hair and dander: Secondary | ICD-10-CM | POA: Diagnosis not present

## 2021-01-01 DIAGNOSIS — J3089 Other allergic rhinitis: Secondary | ICD-10-CM | POA: Diagnosis not present

## 2021-01-01 DIAGNOSIS — J3081 Allergic rhinitis due to animal (cat) (dog) hair and dander: Secondary | ICD-10-CM | POA: Diagnosis not present

## 2021-01-01 DIAGNOSIS — J301 Allergic rhinitis due to pollen: Secondary | ICD-10-CM | POA: Diagnosis not present

## 2021-01-09 DIAGNOSIS — J3089 Other allergic rhinitis: Secondary | ICD-10-CM | POA: Diagnosis not present

## 2021-01-09 DIAGNOSIS — J301 Allergic rhinitis due to pollen: Secondary | ICD-10-CM | POA: Diagnosis not present

## 2021-01-09 DIAGNOSIS — J3081 Allergic rhinitis due to animal (cat) (dog) hair and dander: Secondary | ICD-10-CM | POA: Diagnosis not present

## 2021-01-16 DIAGNOSIS — J3081 Allergic rhinitis due to animal (cat) (dog) hair and dander: Secondary | ICD-10-CM | POA: Diagnosis not present

## 2021-01-16 DIAGNOSIS — J301 Allergic rhinitis due to pollen: Secondary | ICD-10-CM | POA: Diagnosis not present

## 2021-01-16 DIAGNOSIS — J3089 Other allergic rhinitis: Secondary | ICD-10-CM | POA: Diagnosis not present

## 2021-01-23 DIAGNOSIS — J301 Allergic rhinitis due to pollen: Secondary | ICD-10-CM | POA: Diagnosis not present

## 2021-01-23 DIAGNOSIS — J3089 Other allergic rhinitis: Secondary | ICD-10-CM | POA: Diagnosis not present

## 2021-01-23 DIAGNOSIS — J3081 Allergic rhinitis due to animal (cat) (dog) hair and dander: Secondary | ICD-10-CM | POA: Diagnosis not present

## 2021-01-25 DIAGNOSIS — J3089 Other allergic rhinitis: Secondary | ICD-10-CM | POA: Diagnosis not present

## 2021-02-02 DIAGNOSIS — J3089 Other allergic rhinitis: Secondary | ICD-10-CM | POA: Diagnosis not present

## 2021-02-02 DIAGNOSIS — J301 Allergic rhinitis due to pollen: Secondary | ICD-10-CM | POA: Diagnosis not present

## 2021-02-02 DIAGNOSIS — J3081 Allergic rhinitis due to animal (cat) (dog) hair and dander: Secondary | ICD-10-CM | POA: Diagnosis not present

## 2021-02-08 DIAGNOSIS — G43909 Migraine, unspecified, not intractable, without status migrainosus: Secondary | ICD-10-CM | POA: Diagnosis not present

## 2021-02-08 DIAGNOSIS — J3089 Other allergic rhinitis: Secondary | ICD-10-CM | POA: Diagnosis not present

## 2021-02-08 DIAGNOSIS — J301 Allergic rhinitis due to pollen: Secondary | ICD-10-CM | POA: Diagnosis not present

## 2021-02-08 DIAGNOSIS — J3081 Allergic rhinitis due to animal (cat) (dog) hair and dander: Secondary | ICD-10-CM | POA: Diagnosis not present

## 2021-02-08 DIAGNOSIS — R109 Unspecified abdominal pain: Secondary | ICD-10-CM | POA: Diagnosis not present

## 2021-02-20 ENCOUNTER — Other Ambulatory Visit: Payer: Self-pay | Admitting: Nurse Practitioner

## 2021-02-20 DIAGNOSIS — R109 Unspecified abdominal pain: Secondary | ICD-10-CM

## 2021-02-26 ENCOUNTER — Ambulatory Visit
Admission: RE | Admit: 2021-02-26 | Discharge: 2021-02-26 | Disposition: A | Discharge status: Home / Self Care | Payer: BC Managed Care – PPO | Source: Ambulatory Visit | Attending: Nurse Practitioner | Admitting: Nurse Practitioner

## 2021-02-26 DIAGNOSIS — R109 Unspecified abdominal pain: Secondary | ICD-10-CM

## 2021-02-27 DIAGNOSIS — H1045 Other chronic allergic conjunctivitis: Secondary | ICD-10-CM | POA: Diagnosis not present

## 2021-02-27 DIAGNOSIS — J301 Allergic rhinitis due to pollen: Secondary | ICD-10-CM | POA: Diagnosis not present

## 2021-02-27 DIAGNOSIS — J3089 Other allergic rhinitis: Secondary | ICD-10-CM | POA: Diagnosis not present

## 2021-02-27 DIAGNOSIS — J3081 Allergic rhinitis due to animal (cat) (dog) hair and dander: Secondary | ICD-10-CM | POA: Diagnosis not present

## 2021-03-20 DIAGNOSIS — J3089 Other allergic rhinitis: Secondary | ICD-10-CM | POA: Diagnosis not present

## 2021-03-20 DIAGNOSIS — J3081 Allergic rhinitis due to animal (cat) (dog) hair and dander: Secondary | ICD-10-CM | POA: Diagnosis not present

## 2021-03-20 DIAGNOSIS — J301 Allergic rhinitis due to pollen: Secondary | ICD-10-CM | POA: Diagnosis not present

## 2021-03-20 DIAGNOSIS — H1045 Other chronic allergic conjunctivitis: Secondary | ICD-10-CM | POA: Diagnosis not present

## 2021-03-27 DIAGNOSIS — J301 Allergic rhinitis due to pollen: Secondary | ICD-10-CM | POA: Diagnosis not present

## 2021-03-27 DIAGNOSIS — J3081 Allergic rhinitis due to animal (cat) (dog) hair and dander: Secondary | ICD-10-CM | POA: Diagnosis not present

## 2021-04-18 DIAGNOSIS — J3089 Other allergic rhinitis: Secondary | ICD-10-CM | POA: Diagnosis not present

## 2021-04-18 DIAGNOSIS — J3081 Allergic rhinitis due to animal (cat) (dog) hair and dander: Secondary | ICD-10-CM | POA: Diagnosis not present

## 2021-04-18 DIAGNOSIS — J301 Allergic rhinitis due to pollen: Secondary | ICD-10-CM | POA: Diagnosis not present

## 2021-04-27 DIAGNOSIS — J3081 Allergic rhinitis due to animal (cat) (dog) hair and dander: Secondary | ICD-10-CM | POA: Diagnosis not present

## 2021-04-27 DIAGNOSIS — J3089 Other allergic rhinitis: Secondary | ICD-10-CM | POA: Diagnosis not present

## 2021-04-27 DIAGNOSIS — J301 Allergic rhinitis due to pollen: Secondary | ICD-10-CM | POA: Diagnosis not present

## 2021-05-14 DIAGNOSIS — J3089 Other allergic rhinitis: Secondary | ICD-10-CM | POA: Diagnosis not present

## 2021-05-14 DIAGNOSIS — J3081 Allergic rhinitis due to animal (cat) (dog) hair and dander: Secondary | ICD-10-CM | POA: Diagnosis not present

## 2021-05-14 DIAGNOSIS — J301 Allergic rhinitis due to pollen: Secondary | ICD-10-CM | POA: Diagnosis not present

## 2021-05-22 DIAGNOSIS — J301 Allergic rhinitis due to pollen: Secondary | ICD-10-CM | POA: Diagnosis not present

## 2021-05-22 DIAGNOSIS — J3089 Other allergic rhinitis: Secondary | ICD-10-CM | POA: Diagnosis not present

## 2021-05-22 DIAGNOSIS — J3081 Allergic rhinitis due to animal (cat) (dog) hair and dander: Secondary | ICD-10-CM | POA: Diagnosis not present

## 2021-05-29 DIAGNOSIS — J3089 Other allergic rhinitis: Secondary | ICD-10-CM | POA: Diagnosis not present

## 2021-05-29 DIAGNOSIS — J301 Allergic rhinitis due to pollen: Secondary | ICD-10-CM | POA: Diagnosis not present

## 2021-05-30 DIAGNOSIS — J3089 Other allergic rhinitis: Secondary | ICD-10-CM | POA: Diagnosis not present

## 2021-05-30 DIAGNOSIS — J301 Allergic rhinitis due to pollen: Secondary | ICD-10-CM | POA: Diagnosis not present

## 2021-06-14 DIAGNOSIS — J3081 Allergic rhinitis due to animal (cat) (dog) hair and dander: Secondary | ICD-10-CM | POA: Diagnosis not present

## 2021-06-14 DIAGNOSIS — Z1231 Encounter for screening mammogram for malignant neoplasm of breast: Secondary | ICD-10-CM | POA: Diagnosis not present

## 2021-06-14 DIAGNOSIS — J301 Allergic rhinitis due to pollen: Secondary | ICD-10-CM | POA: Diagnosis not present

## 2021-06-14 DIAGNOSIS — J3089 Other allergic rhinitis: Secondary | ICD-10-CM | POA: Diagnosis not present

## 2021-06-20 DIAGNOSIS — J3081 Allergic rhinitis due to animal (cat) (dog) hair and dander: Secondary | ICD-10-CM | POA: Diagnosis not present

## 2021-06-20 DIAGNOSIS — J301 Allergic rhinitis due to pollen: Secondary | ICD-10-CM | POA: Diagnosis not present

## 2021-06-20 DIAGNOSIS — J3089 Other allergic rhinitis: Secondary | ICD-10-CM | POA: Diagnosis not present

## 2021-07-04 DIAGNOSIS — Z131 Encounter for screening for diabetes mellitus: Secondary | ICD-10-CM | POA: Diagnosis not present

## 2021-07-04 DIAGNOSIS — Z13220 Encounter for screening for lipoid disorders: Secondary | ICD-10-CM | POA: Diagnosis not present

## 2021-07-04 DIAGNOSIS — Z0000 Encounter for general adult medical examination without abnormal findings: Secondary | ICD-10-CM | POA: Diagnosis not present

## 2021-07-06 DIAGNOSIS — J3089 Other allergic rhinitis: Secondary | ICD-10-CM | POA: Diagnosis not present

## 2021-07-06 DIAGNOSIS — J301 Allergic rhinitis due to pollen: Secondary | ICD-10-CM | POA: Diagnosis not present

## 2021-07-06 DIAGNOSIS — J3081 Allergic rhinitis due to animal (cat) (dog) hair and dander: Secondary | ICD-10-CM | POA: Diagnosis not present

## 2021-07-11 DIAGNOSIS — J3081 Allergic rhinitis due to animal (cat) (dog) hair and dander: Secondary | ICD-10-CM | POA: Diagnosis not present

## 2021-07-11 DIAGNOSIS — J3089 Other allergic rhinitis: Secondary | ICD-10-CM | POA: Diagnosis not present

## 2021-07-11 DIAGNOSIS — J301 Allergic rhinitis due to pollen: Secondary | ICD-10-CM | POA: Diagnosis not present

## 2021-07-18 DIAGNOSIS — J301 Allergic rhinitis due to pollen: Secondary | ICD-10-CM | POA: Diagnosis not present

## 2021-07-18 DIAGNOSIS — J3081 Allergic rhinitis due to animal (cat) (dog) hair and dander: Secondary | ICD-10-CM | POA: Diagnosis not present

## 2021-07-18 DIAGNOSIS — J3089 Other allergic rhinitis: Secondary | ICD-10-CM | POA: Diagnosis not present

## 2021-07-26 DIAGNOSIS — J301 Allergic rhinitis due to pollen: Secondary | ICD-10-CM | POA: Diagnosis not present

## 2021-07-26 DIAGNOSIS — J3081 Allergic rhinitis due to animal (cat) (dog) hair and dander: Secondary | ICD-10-CM | POA: Diagnosis not present

## 2021-07-26 DIAGNOSIS — J3089 Other allergic rhinitis: Secondary | ICD-10-CM | POA: Diagnosis not present

## 2021-08-15 DIAGNOSIS — J301 Allergic rhinitis due to pollen: Secondary | ICD-10-CM | POA: Diagnosis not present

## 2021-08-15 DIAGNOSIS — J3081 Allergic rhinitis due to animal (cat) (dog) hair and dander: Secondary | ICD-10-CM | POA: Diagnosis not present

## 2021-08-15 DIAGNOSIS — J3089 Other allergic rhinitis: Secondary | ICD-10-CM | POA: Diagnosis not present

## 2021-09-06 DIAGNOSIS — J3089 Other allergic rhinitis: Secondary | ICD-10-CM | POA: Diagnosis not present

## 2021-09-06 DIAGNOSIS — J301 Allergic rhinitis due to pollen: Secondary | ICD-10-CM | POA: Diagnosis not present

## 2021-09-06 DIAGNOSIS — J3081 Allergic rhinitis due to animal (cat) (dog) hair and dander: Secondary | ICD-10-CM | POA: Diagnosis not present

## 2021-09-13 DIAGNOSIS — J301 Allergic rhinitis due to pollen: Secondary | ICD-10-CM | POA: Diagnosis not present

## 2021-09-13 DIAGNOSIS — J3081 Allergic rhinitis due to animal (cat) (dog) hair and dander: Secondary | ICD-10-CM | POA: Diagnosis not present

## 2021-09-13 DIAGNOSIS — J3089 Other allergic rhinitis: Secondary | ICD-10-CM | POA: Diagnosis not present

## 2021-09-20 DIAGNOSIS — J3081 Allergic rhinitis due to animal (cat) (dog) hair and dander: Secondary | ICD-10-CM | POA: Diagnosis not present

## 2021-09-20 DIAGNOSIS — J3089 Other allergic rhinitis: Secondary | ICD-10-CM | POA: Diagnosis not present

## 2021-09-20 DIAGNOSIS — J301 Allergic rhinitis due to pollen: Secondary | ICD-10-CM | POA: Diagnosis not present

## 2021-09-24 DIAGNOSIS — J019 Acute sinusitis, unspecified: Secondary | ICD-10-CM | POA: Diagnosis not present

## 2021-09-24 DIAGNOSIS — D1779 Benign lipomatous neoplasm of other sites: Secondary | ICD-10-CM | POA: Diagnosis not present

## 2021-09-27 DIAGNOSIS — J3081 Allergic rhinitis due to animal (cat) (dog) hair and dander: Secondary | ICD-10-CM | POA: Diagnosis not present

## 2021-09-27 DIAGNOSIS — J3089 Other allergic rhinitis: Secondary | ICD-10-CM | POA: Diagnosis not present

## 2021-09-27 DIAGNOSIS — J301 Allergic rhinitis due to pollen: Secondary | ICD-10-CM | POA: Diagnosis not present

## 2021-10-05 DIAGNOSIS — J029 Acute pharyngitis, unspecified: Secondary | ICD-10-CM | POA: Diagnosis not present

## 2021-10-11 DIAGNOSIS — J3081 Allergic rhinitis due to animal (cat) (dog) hair and dander: Secondary | ICD-10-CM | POA: Diagnosis not present

## 2021-10-11 DIAGNOSIS — J3089 Other allergic rhinitis: Secondary | ICD-10-CM | POA: Diagnosis not present

## 2021-10-11 DIAGNOSIS — J301 Allergic rhinitis due to pollen: Secondary | ICD-10-CM | POA: Diagnosis not present

## 2021-10-18 DIAGNOSIS — J3081 Allergic rhinitis due to animal (cat) (dog) hair and dander: Secondary | ICD-10-CM | POA: Diagnosis not present

## 2021-10-18 DIAGNOSIS — J301 Allergic rhinitis due to pollen: Secondary | ICD-10-CM | POA: Diagnosis not present

## 2021-10-18 DIAGNOSIS — J3089 Other allergic rhinitis: Secondary | ICD-10-CM | POA: Diagnosis not present

## 2021-10-25 DIAGNOSIS — J3081 Allergic rhinitis due to animal (cat) (dog) hair and dander: Secondary | ICD-10-CM | POA: Diagnosis not present

## 2021-10-25 DIAGNOSIS — J301 Allergic rhinitis due to pollen: Secondary | ICD-10-CM | POA: Diagnosis not present

## 2021-10-25 DIAGNOSIS — J3089 Other allergic rhinitis: Secondary | ICD-10-CM | POA: Diagnosis not present

## 2021-11-01 DIAGNOSIS — J3089 Other allergic rhinitis: Secondary | ICD-10-CM | POA: Diagnosis not present

## 2021-11-01 DIAGNOSIS — J301 Allergic rhinitis due to pollen: Secondary | ICD-10-CM | POA: Diagnosis not present

## 2021-11-01 DIAGNOSIS — J3081 Allergic rhinitis due to animal (cat) (dog) hair and dander: Secondary | ICD-10-CM | POA: Diagnosis not present

## 2021-11-12 DIAGNOSIS — H1045 Other chronic allergic conjunctivitis: Secondary | ICD-10-CM | POA: Diagnosis not present

## 2021-11-12 DIAGNOSIS — J3081 Allergic rhinitis due to animal (cat) (dog) hair and dander: Secondary | ICD-10-CM | POA: Diagnosis not present

## 2021-11-12 DIAGNOSIS — J301 Allergic rhinitis due to pollen: Secondary | ICD-10-CM | POA: Diagnosis not present

## 2021-11-12 DIAGNOSIS — J3089 Other allergic rhinitis: Secondary | ICD-10-CM | POA: Diagnosis not present

## 2021-11-16 DIAGNOSIS — J301 Allergic rhinitis due to pollen: Secondary | ICD-10-CM | POA: Diagnosis not present

## 2021-11-16 DIAGNOSIS — J3089 Other allergic rhinitis: Secondary | ICD-10-CM | POA: Diagnosis not present

## 2021-11-16 DIAGNOSIS — J3081 Allergic rhinitis due to animal (cat) (dog) hair and dander: Secondary | ICD-10-CM | POA: Diagnosis not present

## 2021-11-22 DIAGNOSIS — J301 Allergic rhinitis due to pollen: Secondary | ICD-10-CM | POA: Diagnosis not present

## 2021-11-22 DIAGNOSIS — J3081 Allergic rhinitis due to animal (cat) (dog) hair and dander: Secondary | ICD-10-CM | POA: Diagnosis not present

## 2021-11-22 DIAGNOSIS — J3089 Other allergic rhinitis: Secondary | ICD-10-CM | POA: Diagnosis not present

## 2021-12-04 DIAGNOSIS — J301 Allergic rhinitis due to pollen: Secondary | ICD-10-CM | POA: Diagnosis not present

## 2021-12-04 DIAGNOSIS — J3089 Other allergic rhinitis: Secondary | ICD-10-CM | POA: Diagnosis not present

## 2021-12-04 DIAGNOSIS — J3081 Allergic rhinitis due to animal (cat) (dog) hair and dander: Secondary | ICD-10-CM | POA: Diagnosis not present

## 2021-12-18 DIAGNOSIS — J301 Allergic rhinitis due to pollen: Secondary | ICD-10-CM | POA: Diagnosis not present

## 2021-12-18 DIAGNOSIS — J3081 Allergic rhinitis due to animal (cat) (dog) hair and dander: Secondary | ICD-10-CM | POA: Diagnosis not present

## 2021-12-18 DIAGNOSIS — J3089 Other allergic rhinitis: Secondary | ICD-10-CM | POA: Diagnosis not present

## 2021-12-24 DIAGNOSIS — J3089 Other allergic rhinitis: Secondary | ICD-10-CM | POA: Diagnosis not present

## 2021-12-24 DIAGNOSIS — J3081 Allergic rhinitis due to animal (cat) (dog) hair and dander: Secondary | ICD-10-CM | POA: Diagnosis not present

## 2021-12-24 DIAGNOSIS — J301 Allergic rhinitis due to pollen: Secondary | ICD-10-CM | POA: Diagnosis not present

## 2021-12-31 DIAGNOSIS — J301 Allergic rhinitis due to pollen: Secondary | ICD-10-CM | POA: Diagnosis not present

## 2021-12-31 DIAGNOSIS — J3089 Other allergic rhinitis: Secondary | ICD-10-CM | POA: Diagnosis not present

## 2021-12-31 DIAGNOSIS — J3081 Allergic rhinitis due to animal (cat) (dog) hair and dander: Secondary | ICD-10-CM | POA: Diagnosis not present

## 2022-01-17 DIAGNOSIS — J3081 Allergic rhinitis due to animal (cat) (dog) hair and dander: Secondary | ICD-10-CM | POA: Diagnosis not present

## 2022-01-17 DIAGNOSIS — J301 Allergic rhinitis due to pollen: Secondary | ICD-10-CM | POA: Diagnosis not present

## 2022-01-17 DIAGNOSIS — J3089 Other allergic rhinitis: Secondary | ICD-10-CM | POA: Diagnosis not present

## 2022-02-14 DIAGNOSIS — J301 Allergic rhinitis due to pollen: Secondary | ICD-10-CM | POA: Diagnosis not present

## 2022-02-14 DIAGNOSIS — J3089 Other allergic rhinitis: Secondary | ICD-10-CM | POA: Diagnosis not present

## 2022-02-14 DIAGNOSIS — J3081 Allergic rhinitis due to animal (cat) (dog) hair and dander: Secondary | ICD-10-CM | POA: Diagnosis not present

## 2022-03-01 DIAGNOSIS — J301 Allergic rhinitis due to pollen: Secondary | ICD-10-CM | POA: Diagnosis not present

## 2022-03-01 DIAGNOSIS — J3081 Allergic rhinitis due to animal (cat) (dog) hair and dander: Secondary | ICD-10-CM | POA: Diagnosis not present

## 2022-03-01 DIAGNOSIS — J3089 Other allergic rhinitis: Secondary | ICD-10-CM | POA: Diagnosis not present

## 2022-03-07 DIAGNOSIS — J3081 Allergic rhinitis due to animal (cat) (dog) hair and dander: Secondary | ICD-10-CM | POA: Diagnosis not present

## 2022-03-07 DIAGNOSIS — J301 Allergic rhinitis due to pollen: Secondary | ICD-10-CM | POA: Diagnosis not present

## 2022-03-07 DIAGNOSIS — J3089 Other allergic rhinitis: Secondary | ICD-10-CM | POA: Diagnosis not present

## 2022-03-21 DIAGNOSIS — J301 Allergic rhinitis due to pollen: Secondary | ICD-10-CM | POA: Diagnosis not present

## 2022-03-21 DIAGNOSIS — J3089 Other allergic rhinitis: Secondary | ICD-10-CM | POA: Diagnosis not present

## 2022-03-21 DIAGNOSIS — J3081 Allergic rhinitis due to animal (cat) (dog) hair and dander: Secondary | ICD-10-CM | POA: Diagnosis not present

## 2022-04-05 DIAGNOSIS — J3089 Other allergic rhinitis: Secondary | ICD-10-CM | POA: Diagnosis not present

## 2022-04-05 DIAGNOSIS — J301 Allergic rhinitis due to pollen: Secondary | ICD-10-CM | POA: Diagnosis not present

## 2022-04-05 DIAGNOSIS — J3081 Allergic rhinitis due to animal (cat) (dog) hair and dander: Secondary | ICD-10-CM | POA: Diagnosis not present

## 2022-04-11 DIAGNOSIS — J301 Allergic rhinitis due to pollen: Secondary | ICD-10-CM | POA: Diagnosis not present

## 2022-04-11 DIAGNOSIS — J3089 Other allergic rhinitis: Secondary | ICD-10-CM | POA: Diagnosis not present

## 2022-04-11 DIAGNOSIS — J3081 Allergic rhinitis due to animal (cat) (dog) hair and dander: Secondary | ICD-10-CM | POA: Diagnosis not present

## 2022-04-18 DIAGNOSIS — J301 Allergic rhinitis due to pollen: Secondary | ICD-10-CM | POA: Diagnosis not present

## 2022-04-18 DIAGNOSIS — J3089 Other allergic rhinitis: Secondary | ICD-10-CM | POA: Diagnosis not present

## 2022-04-18 DIAGNOSIS — J3081 Allergic rhinitis due to animal (cat) (dog) hair and dander: Secondary | ICD-10-CM | POA: Diagnosis not present

## 2022-04-25 DIAGNOSIS — J3089 Other allergic rhinitis: Secondary | ICD-10-CM | POA: Diagnosis not present

## 2022-04-25 DIAGNOSIS — J3081 Allergic rhinitis due to animal (cat) (dog) hair and dander: Secondary | ICD-10-CM | POA: Diagnosis not present

## 2022-04-25 DIAGNOSIS — J301 Allergic rhinitis due to pollen: Secondary | ICD-10-CM | POA: Diagnosis not present

## 2022-05-02 DIAGNOSIS — J3089 Other allergic rhinitis: Secondary | ICD-10-CM | POA: Diagnosis not present

## 2022-05-02 DIAGNOSIS — J301 Allergic rhinitis due to pollen: Secondary | ICD-10-CM | POA: Diagnosis not present

## 2022-05-02 DIAGNOSIS — J3081 Allergic rhinitis due to animal (cat) (dog) hair and dander: Secondary | ICD-10-CM | POA: Diagnosis not present

## 2022-06-17 DIAGNOSIS — Z1231 Encounter for screening mammogram for malignant neoplasm of breast: Secondary | ICD-10-CM | POA: Diagnosis not present

## 2022-11-01 DIAGNOSIS — S29011A Strain of muscle and tendon of front wall of thorax, initial encounter: Secondary | ICD-10-CM | POA: Diagnosis not present

## 2022-11-04 ENCOUNTER — Emergency Department (HOSPITAL_COMMUNITY): Payer: BC Managed Care – PPO

## 2022-11-04 ENCOUNTER — Emergency Department (HOSPITAL_COMMUNITY)
Admission: EM | Admit: 2022-11-04 | Discharge: 2022-11-04 | Disposition: A | Discharge status: Home / Self Care | Payer: BC Managed Care – PPO | Attending: Emergency Medicine | Admitting: Emergency Medicine

## 2022-11-04 DIAGNOSIS — D72829 Elevated white blood cell count, unspecified: Secondary | ICD-10-CM | POA: Diagnosis not present

## 2022-11-04 DIAGNOSIS — J45909 Unspecified asthma, uncomplicated: Secondary | ICD-10-CM | POA: Diagnosis not present

## 2022-11-04 DIAGNOSIS — R079 Chest pain, unspecified: Secondary | ICD-10-CM | POA: Diagnosis not present

## 2022-11-04 DIAGNOSIS — R0789 Other chest pain: Secondary | ICD-10-CM | POA: Diagnosis not present

## 2022-11-04 DIAGNOSIS — R0781 Pleurodynia: Secondary | ICD-10-CM | POA: Diagnosis not present

## 2022-11-04 LAB — COMPREHENSIVE METABOLIC PANEL
ALT: 29 U/L (ref 0–44)
AST: 34 U/L (ref 15–41)
Albumin: 4.1 g/dL (ref 3.5–5.0)
Alkaline Phosphatase: 70 U/L (ref 38–126)
Anion gap: 12 (ref 5–15)
BUN: 28 mg/dL — ABNORMAL HIGH (ref 6–20)
CO2: 22 mmol/L (ref 22–32)
Calcium: 9.2 mg/dL (ref 8.9–10.3)
Chloride: 104 mmol/L (ref 98–111)
Creatinine, Ser: 0.76 mg/dL (ref 0.44–1.00)
GFR, Estimated: >60 mL/min (ref >60)
Glucose, Bld: 98 mg/dL (ref 70–99)
Potassium: 5.1 mmol/L (ref 3.5–5.1)
Sodium: 138 mmol/L (ref 135–145)
Total Bilirubin: 0.9 mg/dL (ref 0.3–1.2)
Total Protein: 6.8 g/dL (ref 6.5–8.1)

## 2022-11-04 LAB — CBC WITH DIFFERENTIAL/PLATELET
Abs Immature Granulocytes: 0.05 10*3/uL (ref 0.00–0.07)
Basophils Absolute: 0 10*3/uL (ref 0.0–0.1)
Basophils Relative: 0 %
Eosinophils Absolute: 0 10*3/uL (ref 0.0–0.5)
Eosinophils Relative: 0 %
HCT: 40.8 % (ref 36.0–46.0)
Hemoglobin: 13.6 g/dL (ref 12.0–15.0)
Immature Granulocytes: 1 %
Lymphocytes Relative: 16 %
Lymphs Abs: 1.7 10*3/uL (ref 0.7–4.0)
MCH: 30.4 pg (ref 26.0–34.0)
MCHC: 33.3 g/dL (ref 30.0–36.0)
MCV: 91.3 fL (ref 80.0–100.0)
Monocytes Absolute: 0.6 10*3/uL (ref 0.1–1.0)
Monocytes Relative: 5 %
Neutro Abs: 8.5 10*3/uL — ABNORMAL HIGH (ref 1.7–7.7)
Neutrophils Relative %: 78 %
Platelets: 273 10*3/uL (ref 150–400)
RBC: 4.47 MIL/uL (ref 3.87–5.11)
RDW: 13.3 % (ref 11.5–15.5)
WBC: 10.8 10*3/uL — ABNORMAL HIGH (ref 4.0–10.5)
nRBC: 0 % (ref 0.0–0.2)

## 2022-11-04 LAB — TROPONIN I (HIGH SENSITIVITY)
Troponin I (High Sensitivity): 5 ng/L (ref <18)
Troponin I (High Sensitivity): 4 ng/L (ref <18)

## 2022-11-04 MED ORDER — IOHEXOL 350 MG/ML SOLN
75.0000 mL | Freq: Once | INTRAVENOUS | Status: AC | PRN
  Administered 2022-11-04: 75 mL via INTRAVENOUS

## 2022-11-04 NOTE — ED Triage Notes (Signed)
Patient complains of tightness under her left breast that started one week ago, most severe earlier today. Patient also complains of pain on the right side of her jaw today. Patient denies dizziness, reports some nausea earlier today that resolved spontaneously. Patient BP 186/95 in triage, denies history of hypertension.

## 2022-11-04 NOTE — ED Provider Triage Note (Signed)
Emergency Medicine Provider Triage Evaluation Note  Alicia Martin , a 57 y.o. female  was evaluated in triage.  Pt complains of chest pain and left rib pain.  Patient stated left rib pain began a week ago after she was doing bench press and noticed it.  Patient states pain has been present through Aleve, ibuprofen, lidocaine patches but does not want any muscle relaxers.  Patient stated 30 minutes ago she began experiencing substernal chest pain that did not radiate and was a pressure.  Patient endorsed with this chest pain right-sided jaw pain.  She states she was nauseous on the drive here but that is since resolved.  Patient denied shortness of breath, temporal artery tenderness, vision changes, headache, neck pain, abdominal pain, vomiting, fevers, chills, change in sensation/motor skills  Review of Systems  Positive: See HPI Negative: See HPI  Physical Exam  BP (!) 186/95 (BP Location: Right Arm)   Pulse 98   Temp 98.1 F (36.7 C) (Oral)   Resp 18   LMP 04/26/2012   SpO2 100%  Gen:   Awake, no distress   Resp:  Normal effort  MSK:   Moves extremities without difficulty  Other:  No temporal artery tenderness to palpation, eyes PERRL bilaterally, no step-off/crepitus/abnormalities palpated on left ribs  Medical Decision Making  Medically screening exam initiated at 4:12 PM.  Appropriate orders placed.  Alicia Martin was informed that the remainder of the evaluation will be completed by another provider, this initial triage assessment does not replace that evaluation, and the importance of remaining in the ED until their evaluation is complete.  Workup initiated, I suspect left rib pain is muscle skeletal in nature, patient stable at this time   Alicia Martin 11/04/22 1621

## 2022-11-04 NOTE — ED Provider Notes (Signed)
[facility] Provider Note   CSN: [id number] Arrival date & time: 11/04/22  1605     History  Chief Complaint  Patient presents with   Chest Pain    Alicia Martin is a 57 y.o. female.   Chest Pain Associated symptoms: nausea   Patient presents for chest pain.  Medical history includes allergic rhinitis, asthma, migraine headaches.  She has no personal history of cardiac issues.  Her mother did have a heart attack in her 30s.  Recent history is as follows: 1.5 weeks ago, patient was doing a press while in the gym.  She experienced a sudden tearing sensation to the left lateral chest wall.  She has since had ongoing pain in this area and has been treating with lidocaine patches and NSAIDs.  Earlier today, she was doing some volunteer work.  This included some light exertion.  She experienced a substernal chest pain with radiation to her right jaw.  At the time, it was 9/10 in severity.  She had some transient nausea.  When she arrived in ED triage was 7/10.  She has not taken any medications for it but it has completely resolved at this time.  Although patient did note elevated blood pressure earlier today, she states that her blood pressure is typically normal and she does not take any antihypertensive medications.    Home Medications Prior to Admission medications   Medication Sig Start Date End Date Taking? Authorizing Provider  albuterol (PROVENTIL HFA;VENTOLIN HFA) 108 (90 BASE) MCG/ACT inhaler Inhale 2 puffs into the lungs every 6 (six) hours as needed. For shortness of breath   Yes [provider]  cetirizine (ZYRTEC) 10 MG chewable tablet Chew 10 mg by mouth daily.   Yes [provider]  fexofenadine (ALLEGRA) 180 MG tablet Take 180 mg by mouth at bedtime.   Yes [provider]  gabapentin (NEURONTIN) 300 MG capsule Take 300 mg by mouth 3 (three) times daily. 11/01/22  Yes [provider]  predniSONE  (DELTASONE) 20 MG tablet Take 20 mg by mouth 2 (two) times daily. 12/07/14  Yes [provider]  zolmitriptan (ZOMIG-ZMT) 5 MG disintegrating tablet Take 5 mg by mouth daily as needed for migraine. For migraine   Yes [provider]  oxyCODONE-acetaminophen (PERCOCET) 5-325 MG tablet Take 1 tablet by mouth every 4 (four) hours as needed for severe pain. Patient not taking: Reported on 11/04/2022 02/08/19   Leta Baptist, MD      Allergies    Gluten meal    Review of Systems   Review of Systems  Cardiovascular:  Positive for chest pain.  Gastrointestinal:  Positive for nausea.  All other systems reviewed and are negative.   Physical Exam Updated Vital Signs BP (!) 143/82   Pulse 83   Temp 98.1 F (36.7 C) (Oral)   Resp 19   LMP 04/26/2012   SpO2 98%  Physical Exam Vitals and nursing note reviewed.  Constitutional:      General: She is not in acute distress.    Appearance: She is well-developed. She is not ill-appearing, toxic-appearing or diaphoretic.  HENT:     Head: Normocephalic and atraumatic.  Eyes:     Conjunctiva/sclera: Conjunctivae normal.  Cardiovascular:     Rate and Rhythm: Normal rate and regular rhythm.     Heart sounds: No murmur heard. Pulmonary:     Effort: Pulmonary effort is normal. No respiratory distress.     Breath sounds:  Normal breath sounds. No decreased breath sounds, wheezing, rhonchi or rales.  Chest:     Chest wall: No tenderness.  Abdominal:     Palpations: Abdomen is soft.     Tenderness: There is no abdominal tenderness.  Musculoskeletal:        General: No swelling. Normal range of motion.     Cervical back: Normal range of motion and neck supple.     Right lower leg: No edema.     Left lower leg: No edema.  Skin:    General: Skin is warm and dry.     Capillary Refill: Capillary refill takes less than 2 seconds.     Coloration: Skin is not cyanotic or pale.  Neurological:     General: No focal deficit present.      Mental Status: She is alert and oriented to person, place, and time.  Psychiatric:        Mood and Affect: Mood normal.        Behavior: Behavior normal.     ED Results / Procedures / Treatments   Labs (all labs ordered are listed, but only abnormal results are displayed) Labs Reviewed  COMPREHENSIVE METABOLIC PANEL - Abnormal; Notable for the following components:      Result Value   BUN 28 (*)    All other components within normal limits  CBC WITH DIFFERENTIAL/PLATELET - Abnormal; Notable for the following components:   WBC 10.8 (*)    Neutro Abs 8.5 (*)    All other components within normal limits  TROPONIN I (HIGH SENSITIVITY)  TROPONIN I (HIGH SENSITIVITY)    EKG EKG Interpretation  Date/Time:  Monday November 04 2022 16:11:30 EDT Ventricular Rate:  95 PR Interval:  142 QRS Duration: 72 QT Interval:  342 QTC Calculation: 429 R Axis:   34 Text Interpretation: Normal sinus rhythm Confirmed by Godfrey Pick ([id number]) on 11/04/2022 5:33:21 PM  Radiology CT Angio Chest PE W and/or Wo Contrast  Result Date: 11/04/2022 CLINICAL DATA:  Chest pain for 1 week. EXAM: CT ANGIOGRAPHY CHEST WITH CONTRAST TECHNIQUE: Multidetector CT imaging of the chest was performed using the standard protocol during bolus administration of intravenous contrast. Multiplanar CT image reconstructions and MIPs were obtained to evaluate the vascular anatomy. RADIATION DOSE REDUCTION: This exam was performed according to the departmental dose-optimization program which includes automated exposure control, adjustment of the mA and/or kV according to patient size and/or use of iterative reconstruction technique. CONTRAST:  73mL OMNIPAQUE IOHEXOL 350 MG/ML SOLN COMPARISON:  None Available. FINDINGS: Cardiovascular: The heart is normal in size. No pericardial effusion. The aorta is normal in caliber. No dissection or atherosclerotic calcification. No definite coronary artery calcifications. The pulmonary arterial tree is  well opacified. No filling defects to suggest pulmonary embolism. Mediastinum/Nodes: No mediastinal or hilar mass or lymphadenopathy. The esophagus is unremarkable. Lungs/Pleura: No acute pulmonary process. No infiltrates, edema or effusions. No pulmonary lesions or nodules. The tracheobronchial tree is unremarkable. Upper Abdomen: No significant upper abdominal findings. Musculoskeletal: No significant bony findings. No breast masses, supraclavicular or axillary adenopathy. Review of the MIP images confirms the above findings. IMPRESSION: 1. No CT findings for pulmonary embolism. 2. Normal thoracic aorta. 3. No acute pulmonary findings. Electronically Signed   By: Marijo Sanes M.D.   On: 11/04/2022 19:48   DG Ribs Unilateral Left  Result Date: 11/04/2022 CLINICAL DATA:  Left-sided rib pain. EXAM: LEFT RIBS - 2 VIEW COMPARISON:  None Available. FINDINGS: A radiopaque marker was placed at the  site of the patient's pain. No fracture or other bone lesions are seen involving the ribs. IMPRESSION: Negative. Electronically Signed   By: Virgina Norfolk M.D.   On: 11/04/2022 16:48   DG Chest 2 View  Result Date: 11/04/2022 CLINICAL DATA:  Mid chest pain. EXAM: CHEST - 2 VIEW COMPARISON:  None Available. FINDINGS: The heart size and mediastinal contours are within normal limits. Both lungs are clear. The visualized skeletal structures are unremarkable. IMPRESSION: No active cardiopulmonary disease. Electronically Signed   By: Virgina Norfolk M.D.   On: 11/04/2022 16:47    Procedures Procedures    Medications Ordered in ED Medications  iohexol (OMNIPAQUE) 350 MG/ML injection 75 mL (75 mLs Intravenous Contrast Given 11/04/22 1924)    ED Course/ Medical Decision Making/ A&P                             Medical Decision Making Amount and/or Complexity of Data Reviewed Labs: ordered. Radiology: ordered.  Risk Prescription drug management.   This patient presents to the ED for concern of chest  pain, this involves an extensive number of treatment options, and is a complaint that carries with it a high risk of complications and morbidity.  The differential diagnosis includes ACS, GERD, pericarditis, costochondritis, PE   Co morbidities that complicate the patient evaluation  allergic rhinitis, asthma, migraine headaches   Additional history obtained:  Additional history obtained from N/A External records from outside source obtained and reviewed including EMR   Lab Tests:  I Ordered, and personally interpreted labs.  The pertinent results include: Slight leukocytosis, normal kidney function, normal electrolytes, normal troponins x 2   Imaging Studies ordered:  I ordered imaging studies including chest x-ray, CTA chest I independently visualized and interpreted imaging which showed no acute findings I agree with the radiologist interpretation   Cardiac Monitoring: / EKG:  The patient was maintained on a cardiac monitor.  I personally viewed and interpreted the cardiac monitored which showed an underlying rhythm of: Sinus rhythm  Problem List / ED Course / Critical interventions / Medication management  Patient is a pleasant 57 year old female who presents for acute onset of substernal chest pain with radiation to right jaw earlier today.  This occurred while she was performing light exertion with some volunteer work.  On arrival in the ED, chest pain has resolved.  EKG does not show any concerning ST segment changes.  Prior to being bedded in the ED, laboratory workup was initiated.  Initial troponin was normal.  Given the recent onset of her chest pain prior to arrival, will check second troponin.  Given her ongoing left lateral chest wall pain, which is likely muscle strain, will also check CTA for possible other etiologies.  Patient is pain-free at this time and declines any analgesia.  Risk factors for ACS include positive family history for early ACS.  Delta troponin was  normal.  CTA did not show any acute findings.  Patient remained asymptomatic while in the ED.  Per chart review, patient has been seen by cardiology but it has been 5 years since that time.  Given her family history and symptoms today, she would benefit from cardiology outpatient follow-up.  Referral was sent.  Patient was discharged in stable condition.   Social Determinants of Health:  Has access to outpatient care         Final Clinical Impression(s) / ED Diagnoses Final diagnoses:  Chest pain, unspecified type  Rx / DC Orders ED Discharge Orders          Ordered    Ambulatory referral to Cardiology       Comments: If you have not heard from the Cardiology office within the next 72 hours please call [phone].   11/04/22 2159              Godfrey Pick, MD 11/04/22 2200

## 2022-11-04 NOTE — Discharge Instructions (Signed)
The testing done in the emergency department is reassuring.  Please follow-up with cardiology soon as possible.  They can discuss further testing.  If you do not hear from their office in the next couple days, call the number below.  Return the emergency department for any new or worsening symptoms of concern.

## 2022-12-02 DIAGNOSIS — R072 Precordial pain: Secondary | ICD-10-CM | POA: Insufficient documentation

## 2022-12-02 NOTE — Progress Notes (Unsigned)
Cardiology Office Note   Date:  12/04/2022   ID:  Alicia Martin, DOB 05/20/1966, MRN [id number]  PCP:  Shirleen Schirmer, PA-C  Cardiologist:   Rollene Rotunda, MD Referring:  Shirleen Schirmer, PA-C  Chief Complaint  Patient presents with   Chest Pain     History of Present Illness: Alicia Martin is a 57 y.o. female who presents for evaluation of chest pain.  She is referred by Shirleen Schirmer, PA-C She was in the ED last month for this.  I reviewed these records for this visit.    There was no evidence of ischemia.    I had seen her in the past and she had a coronary calcium score which was 0.  She had chest discomfort a few years ago.  She had no prior cardiac history otherwise but has strong family history.  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  She never had this before.  She got clammy.  It was intense.  There were nurses there and they took her blood pressure was 188/100.  He then calmed down after several minutes so she went to the emergency room.  There was no treatment necessary.  She had a CT that ruled out PE.  There was no coronary calcium mentioned.  Troponins were negative.  EKG was nonacute.  She exercises.  She has not been getting any chest discomfort.  She will do 3 times a week 1 hour at a time. The patient denies any new symptoms such as chest discomfort, neck or arm discomfort. There has been no new shortness of breath, PND or orthopnea. There have been no reported palpitations, presyncope or syncope.        Past Medical History:  Diagnosis Date   Allergic rhinitis    Asthma    Celiac disease    Deviated septum    Migraine    Nasal turbinate hypertrophy     Past Surgical History:  Procedure Laterality Date   ENDOSCOPIC CONCHA BULLOSA RESECTION  02/08/2019   Procedure: ENDOSCOPIC CONCHA BULLOSA RESECTION;  Surgeon: Newman Pies, MD;  Location: [facility];  Service: ENT;;   ETHMOIDECTOMY Left 02/08/2019    Procedure: TOTAL ENDOSCOPIC  ETHMOIDECTOMY AND LEFT  FRONTAL RECESS;  Surgeon: Newman Pies, MD;  Location: [facility];  Service: ENT;  Laterality: Left;   MAXILLARY ANTROSTOMY Bilateral 02/08/2019   Procedure: MAXILLARY ANTROSTOMY WITH TISSUE REMOVAL;  Surgeon: Newman Pies, MD;  Location: [facility];  Service: ENT;  Laterality: Bilateral;   NASAL SEPTOPLASTY W/ TURBINOPLASTY N/A 02/08/2019   Procedure: NASAL SEPTOPLASTY WITH TURBINATE REDUCTION;  Surgeon: Newman Pies, MD;  Location: [facility];  Service: ENT;  Laterality: N/A;   NONE     SINUS ENDO WITH FUSION N/A 02/08/2019   Procedure: SINUS ENDO WITH FUSION;  Surgeon: Newman Pies, MD;  Location: [facility];  Service: ENT;  Laterality: N/A;     Current Outpatient Medications  Medication Sig Dispense Refill   albuterol (PROVENTIL HFA;VENTOLIN HFA) 108 (90 BASE) MCG/ACT inhaler Inhale 2 puffs into the lungs every 6 (six) hours as needed. For shortness of breath     cetirizine (ZYRTEC) 10 MG chewable tablet Chew 10 mg by mouth daily.     fexofenadine (ALLEGRA) 180 MG tablet Take 180 mg by mouth at bedtime.     zolmitriptan (ZOMIG-ZMT) 5 MG disintegrating tablet Take 5 mg by mouth daily as needed for  migraine. For migraine     EPINEPHrine (AUVI-Q) 0.3 mg/0.3 mL IJ SOAJ injection Inject 0.3 mg into the muscle as needed for anaphylaxis. (Patient not taking: Reported on 12/03/2022)     No current facility-administered medications for this visit.    Allergies:   Gluten meal    Social History:  The patient  reports that she has quit smoking. Her smoking use included cigarettes. She has never used smokeless tobacco. She reports current alcohol use. She reports that she does not use drugs.   Family History:  The patient's family history includes CAD (age of onset: 67) in her father; COPD in her father; Heart failure (age of onset: 27) in her mother; Pneumonia in her mother.    ROS:  Please see the  history of present illness.   Otherwise, review of systems are positive for none.   All other systems are reviewed and negative.    PHYSICAL EXAM: VS:  BP 120/80 (BP Location: Left Arm, Patient Position: Sitting, Cuff Size: Normal)   Pulse 80   Ht  (1.651 m)   Wt 189 lb 3.2 oz (85.8 kg)   LMP 04/26/2012   SpO2 96%   BMI 31.48 kg/m  , BMI Body mass index is 31.48 kg/m. GENERAL:  Well appearing HEENT:  Pupils equal round and reactive, fundi not visualized, oral mucosa unremarkable NECK:  No jugular venous distention, waveform within normal limits, carotid upstroke brisk and symmetric, no bruits, no thyromegaly LYMPHATICS:  No cervical, inguinal adenopathy LUNGS:  Clear to auscultation bilaterally BACK:  No CVA tenderness CHEST:  Unremarkable HEART:  PMI not displaced or sustained,S1 and S2 within normal limits, no S3, no S4, no clicks, no rubs, no murmurs ABD:  Flat, positive bowel sounds normal in frequency in pitch, no bruits, no rebound, no guarding, no midline pulsatile mass, no hepatomegaly, no splenomegaly EXT:  2 plus pulses throughout, no edema, no cyanosis no clubbing SKIN:  No rashes no nodules NEURO:  Cranial nerves II through XII grossly intact, motor grossly intact throughout PSYCH:  Cognitively intact, oriented to person place and time    EKG:  EKG is ordered today. The ekg ordered 11/04/2022 demonstrates sinus rhythm, rate 95, axis within normal limits, intervals within normal limits,   Recent Labs: 11/04/2022: ALT 29; BUN 28; Creatinine, Ser 0.76; Hemoglobin 13.6; Platelets 273; Potassium 5.1; Sodium 138    Lipid Panel    Component Value Date/Time   CHOL 240 (H) 11/18/2017 0000   TRIG 158 (H) 11/18/2017 0000   HDL 61 11/18/2017 0000   CHOLHDL 3.9 11/18/2017 0000   LDLCALC 147 (H) 11/18/2017 0000      Wt Readings from Last 3 Encounters:  12/03/22 189 lb 3.2 oz (85.8 kg)  02/08/19 185 lb 13.6 oz (84.3 kg)  11/11/17 180 lb (81.6 kg)      Other  studies Reviewed: Additional studies/ records that were reviewed today include: EKG, ED records. Review of the above records demonstrates:  Please see elsewhere in the note.     ASSESSMENT AND PLAN:  Precordial chest pain: Her pain is nonanginal but she has significant risk factors with family history. I will bring the patient back for a POET (Plain Old Exercise Test). This will allow me to screen for obstructive coronary disease, risk stratify and very importantly provide a prescription for exercise.  Dyslipidemia: She has repeat labs pending.  In 2022 LDL was 162.  However, her MESA score was only 3.6.  We talked about a  plant-based diet.  We have also suggested that she get an LP(a).  Current medicines are reviewed at length with the patient today.  The patient does not have concerns regarding medicines.  The following changes have been made:  no change,   Labs/ tests ordered today include:   Orders Placed This Encounter  Procedures   Exercise Tolerance Test     Disposition:   FU with me as needed.     Signed, Rollene Rotunda, MD  12/04/2022 10:37 AM    [facility]

## 2022-12-03 ENCOUNTER — Encounter: Payer: Self-pay | Admitting: Cardiology

## 2022-12-03 ENCOUNTER — Ambulatory Visit: Payer: BC Managed Care – PPO | Attending: Cardiology | Admitting: Cardiology

## 2022-12-03 VITALS — BP 120/80 | HR 80 | Ht 65.0 in | Wt 189.2 lb

## 2022-12-03 DIAGNOSIS — R072 Precordial pain: Secondary | ICD-10-CM

## 2022-12-03 NOTE — Patient Instructions (Signed)
  Testing/Procedures:  Your physician has requested that you have an exercise tolerance test. For further information please visit [email or web address] Please also follow instruction sheet, as given. [address]   Follow-Up: At [facility], you and your health needs are our priority.  As part of our continuing mission to provide you with exceptional heart care, we have created designated Provider Care Teams.  These Care Teams include your primary Cardiologist (physician) and Advanced Practice Providers (APPs -  Physician Assistants and Nurse Practitioners) who all work together to provide you with the care you need, when you need it.  We recommend signing up for the patient portal called "MyChart".  Sign up information is provided on this After Visit Summary.  MyChart is used to connect with patients for Virtual Visits (Telemedicine).  Patients are able to view lab/test results, encounter notes, upcoming appointments, etc.  Non-urgent messages can be sent to your provider as well.   To learn more about what you can do with MyChart, go to [email or web address].    Your next appointment:   As needed  Other Instructions  Lpa to check with next labs

## 2022-12-04 ENCOUNTER — Encounter: Payer: Self-pay | Admitting: Cardiology

## 2022-12-16 ENCOUNTER — Encounter (HOSPITAL_COMMUNITY): Payer: BC Managed Care – PPO

## 2022-12-19 ENCOUNTER — Ambulatory Visit (HOSPITAL_COMMUNITY): Payer: BC Managed Care – PPO

## 2022-12-27 ENCOUNTER — Other Ambulatory Visit: Payer: Self-pay | Admitting: *Deleted

## 2022-12-27 DIAGNOSIS — R072 Precordial pain: Secondary | ICD-10-CM

## 2023-01-14 ENCOUNTER — Encounter (HOSPITAL_COMMUNITY): Payer: BC Managed Care – PPO

## 2023-01-14 ENCOUNTER — Ambulatory Visit: Payer: BC Managed Care – PPO | Attending: Internal Medicine

## 2023-01-14 DIAGNOSIS — R072 Precordial pain: Secondary | ICD-10-CM | POA: Diagnosis not present

## 2023-01-14 LAB — EXERCISE TOLERANCE TEST
Angina Index: 0
Duke Treadmill Score: 6
Estimated workload: 7
Exercise duration (min): 6 min
Exercise duration (sec): 0 s
MPHR: 163 {beats}/min
Peak HR: 150 {beats}/min
Percent HR: 92 %
RPE: 7
Rest HR: 96 {beats}/min
ST Depression (mm): 0 mm

## 2023-01-16 DIAGNOSIS — Z0000 Encounter for general adult medical examination without abnormal findings: Secondary | ICD-10-CM | POA: Diagnosis not present

## 2023-01-16 DIAGNOSIS — E782 Mixed hyperlipidemia: Secondary | ICD-10-CM | POA: Diagnosis not present

## 2023-01-21 DIAGNOSIS — J343 Hypertrophy of nasal turbinates: Secondary | ICD-10-CM | POA: Diagnosis not present

## 2023-01-21 DIAGNOSIS — J324 Chronic pansinusitis: Secondary | ICD-10-CM | POA: Diagnosis not present

## 2023-03-10 DIAGNOSIS — L989 Disorder of the skin and subcutaneous tissue, unspecified: Secondary | ICD-10-CM | POA: Diagnosis not present

## 2023-04-09 DIAGNOSIS — L218 Other seborrheic dermatitis: Secondary | ICD-10-CM | POA: Diagnosis not present

## 2023-04-09 DIAGNOSIS — R590 Localized enlarged lymph nodes: Secondary | ICD-10-CM | POA: Diagnosis not present

## 2023-04-09 DIAGNOSIS — D485 Neoplasm of uncertain behavior of skin: Secondary | ICD-10-CM | POA: Diagnosis not present

## 2023-04-30 DIAGNOSIS — R590 Localized enlarged lymph nodes: Secondary | ICD-10-CM | POA: Diagnosis not present

## 2023-04-30 DIAGNOSIS — D485 Neoplasm of uncertain behavior of skin: Secondary | ICD-10-CM | POA: Diagnosis not present

## 2023-04-30 DIAGNOSIS — L218 Other seborrheic dermatitis: Secondary | ICD-10-CM | POA: Diagnosis not present

## 2023-04-30 DIAGNOSIS — D170 Benign lipomatous neoplasm of skin and subcutaneous tissue of head, face and neck: Secondary | ICD-10-CM | POA: Diagnosis not present

## 2023-05-28 DIAGNOSIS — M25562 Pain in left knee: Secondary | ICD-10-CM | POA: Diagnosis not present

## 2023-06-23 DIAGNOSIS — Z1231 Encounter for screening mammogram for malignant neoplasm of breast: Secondary | ICD-10-CM | POA: Diagnosis not present

## 2023-07-23 ENCOUNTER — Ambulatory Visit (INDEPENDENT_AMBULATORY_CARE_PROVIDER_SITE_OTHER): Payer: BC Managed Care – PPO

## 2023-07-28 DIAGNOSIS — R6882 Decreased libido: Secondary | ICD-10-CM | POA: Diagnosis not present

## 2023-08-12 DIAGNOSIS — J45909 Unspecified asthma, uncomplicated: Secondary | ICD-10-CM | POA: Diagnosis not present

## 2023-08-12 DIAGNOSIS — J020 Streptococcal pharyngitis: Secondary | ICD-10-CM | POA: Diagnosis not present

## 2023-10-27 DIAGNOSIS — M25561 Pain in right knee: Secondary | ICD-10-CM | POA: Diagnosis not present

## 2023-12-02 DIAGNOSIS — H9313 Tinnitus, bilateral: Secondary | ICD-10-CM | POA: Diagnosis not present

## 2023-12-15 DIAGNOSIS — H6121 Impacted cerumen, right ear: Secondary | ICD-10-CM | POA: Diagnosis not present

## 2023-12-15 DIAGNOSIS — H9311 Tinnitus, right ear: Secondary | ICD-10-CM | POA: Diagnosis not present

## 2024-06-23 DIAGNOSIS — Z1231 Encounter for screening mammogram for malignant neoplasm of breast: Secondary | ICD-10-CM | POA: Diagnosis not present
